# Patient Record
Sex: Female | Born: 1935 | Race: Black or African American | Hispanic: No | Marital: Married | Smoking: Never smoker
Health system: Southern US, Community
[De-identification: ages and names within clinical notes are randomized; demographics above are authoritative.]

## PROBLEM LIST (undated history)

## (undated) DIAGNOSIS — I10 Essential (primary) hypertension: Secondary | ICD-10-CM

## (undated) DIAGNOSIS — E119 Type 2 diabetes mellitus without complications: Secondary | ICD-10-CM

## (undated) DIAGNOSIS — I219 Acute myocardial infarction, unspecified: Secondary | ICD-10-CM

## (undated) DIAGNOSIS — E785 Hyperlipidemia, unspecified: Secondary | ICD-10-CM

## (undated) DIAGNOSIS — E039 Hypothyroidism, unspecified: Secondary | ICD-10-CM

## (undated) DIAGNOSIS — K5790 Diverticulosis of intestine, part unspecified, without perforation or abscess without bleeding: Secondary | ICD-10-CM

## (undated) DIAGNOSIS — I2510 Atherosclerotic heart disease of native coronary artery without angina pectoris: Secondary | ICD-10-CM

## (undated) DIAGNOSIS — I447 Left bundle-branch block, unspecified: Secondary | ICD-10-CM

## (undated) HISTORY — DX: Left bundle-branch block, unspecified: I44.7

## (undated) HISTORY — DX: Essential (primary) hypertension: I10

## (undated) HISTORY — DX: Atherosclerotic heart disease of native coronary artery without angina pectoris: I25.10

## (undated) HISTORY — DX: Hypothyroidism, unspecified: E03.9

## (undated) HISTORY — PX: TUBAL LIGATION: SHX77

## (undated) HISTORY — PX: THYROIDECTOMY: SHX17

## (undated) HISTORY — PX: ABDOMINAL HYSTERECTOMY: SHX81

## (undated) HISTORY — DX: Type 2 diabetes mellitus without complications: E11.9

## (undated) HISTORY — DX: Hyperlipidemia, unspecified: E78.5

## (undated) HISTORY — DX: Acute myocardial infarction, unspecified: I21.9

## (undated) HISTORY — DX: Diverticulosis of intestine, part unspecified, without perforation or abscess without bleeding: K57.90

---

## 2009-04-20 ENCOUNTER — Encounter (INDEPENDENT_AMBULATORY_CARE_PROVIDER_SITE_OTHER): Payer: Self-pay | Admitting: Family Medicine

## 2009-04-20 ENCOUNTER — Ambulatory Visit (HOSPITAL_COMMUNITY): Admission: RE | Admit: 2009-04-20 | Discharge: 2009-04-20 | Payer: Self-pay | Admitting: Family Medicine

## 2009-04-20 ENCOUNTER — Ambulatory Visit: Payer: Self-pay

## 2009-04-20 ENCOUNTER — Ambulatory Visit: Payer: Self-pay | Admitting: Cardiology

## 2009-06-15 ENCOUNTER — Encounter: Admission: RE | Admit: 2009-06-15 | Discharge: 2009-06-15 | Payer: Self-pay | Admitting: Emergency Medicine

## 2010-06-30 ENCOUNTER — Inpatient Hospital Stay (HOSPITAL_COMMUNITY)
Admission: EM | Admit: 2010-06-30 | Discharge: 2010-07-04 | DRG: 249 | Disposition: A | Discharge status: Home / Self Care | Payer: Medicare Other | Attending: Cardiology | Admitting: Cardiology

## 2010-06-30 ENCOUNTER — Other Ambulatory Visit: Payer: Self-pay | Admitting: Family Medicine

## 2010-06-30 ENCOUNTER — Emergency Department (HOSPITAL_COMMUNITY): Payer: Medicare Other

## 2010-06-30 ENCOUNTER — Ambulatory Visit
Admission: RE | Admit: 2010-06-30 | Discharge: 2010-06-30 | Disposition: A | Discharge status: Home / Self Care | Payer: Medicare Other | Source: Ambulatory Visit | Attending: Family Medicine | Admitting: Family Medicine

## 2010-06-30 DIAGNOSIS — R1032 Left lower quadrant pain: Secondary | ICD-10-CM

## 2010-06-30 DIAGNOSIS — E039 Hypothyroidism, unspecified: Secondary | ICD-10-CM | POA: Diagnosis present

## 2010-06-30 DIAGNOSIS — I2510 Atherosclerotic heart disease of native coronary artery without angina pectoris: Secondary | ICD-10-CM | POA: Diagnosis present

## 2010-06-30 DIAGNOSIS — I447 Left bundle-branch block, unspecified: Secondary | ICD-10-CM | POA: Diagnosis present

## 2010-06-30 DIAGNOSIS — E876 Hypokalemia: Secondary | ICD-10-CM | POA: Diagnosis not present

## 2010-06-30 DIAGNOSIS — K5730 Diverticulosis of large intestine without perforation or abscess without bleeding: Secondary | ICD-10-CM | POA: Diagnosis present

## 2010-06-30 DIAGNOSIS — R079 Chest pain, unspecified: Secondary | ICD-10-CM

## 2010-06-30 DIAGNOSIS — R7989 Other specified abnormal findings of blood chemistry: Secondary | ICD-10-CM

## 2010-06-30 DIAGNOSIS — I214 Non-ST elevation (NSTEMI) myocardial infarction: Principal | ICD-10-CM | POA: Diagnosis present

## 2010-06-30 DIAGNOSIS — I10 Essential (primary) hypertension: Secondary | ICD-10-CM | POA: Diagnosis present

## 2010-06-30 LAB — COMPREHENSIVE METABOLIC PANEL
AST: 35 U/L (ref 0–37)
Albumin: 4.1 g/dL (ref 3.5–5.2)
BUN: 11 mg/dL (ref 6–23)
CO2: 28 mEq/L (ref 19–32)
Calcium: 9.6 mg/dL (ref 8.4–10.5)
Chloride: 97 mEq/L (ref 96–112)
Glucose, Bld: 204 mg/dL — ABNORMAL HIGH (ref 70–99)
Potassium: 4 mEq/L (ref 3.5–5.1)
Total Protein: 7 g/dL (ref 6.0–8.3)

## 2010-06-30 LAB — CBC
MCH: 30.9 pg (ref 26.0–34.0)
MCHC: 34.1 g/dL (ref 30.0–36.0)
MCV: 90.5 fL (ref 78.0–100.0)
RDW: 12.9 % (ref 11.5–15.5)

## 2010-06-30 LAB — DIFFERENTIAL: Monocytes Absolute: 0.5 10*3/uL (ref 0.1–1.0)

## 2010-07-01 DIAGNOSIS — I200 Unstable angina: Secondary | ICD-10-CM

## 2010-07-01 LAB — CARDIAC PANEL(CRET KIN+CKTOT+MB+TROPI)
CK, MB: 16.3 ng/mL (ref 0.3–4.0)
CK, MB: 16.8 ng/mL (ref 0.3–4.0)
Total CK: 188 U/L — ABNORMAL HIGH (ref 7–177)
Troponin I: 0.07 ng/mL — ABNORMAL HIGH (ref 0.00–0.06)

## 2010-07-01 LAB — LIPID PANEL
Cholesterol: 153 mg/dL (ref 0–200)
LDL Cholesterol: 96 mg/dL (ref 0–99)
Total CHOL/HDL Ratio: 3.3 RATIO
Triglycerides: 54 mg/dL (ref <150)

## 2010-07-01 LAB — URINALYSIS, ROUTINE W REFLEX MICROSCOPIC
Bilirubin Urine: NEGATIVE
Glucose, UA: NEGATIVE mg/dL
Ketones, ur: NEGATIVE mg/dL
Protein, ur: NEGATIVE mg/dL
Urobilinogen, UA: 0.2 mg/dL (ref 0.0–1.0)
pH: 6 (ref 5.0–8.0)

## 2010-07-01 LAB — CK TOTAL AND CKMB (NOT AT ARMC)
Relative Index: 8.1 — ABNORMAL HIGH (ref 0.0–2.5)
Total CK: 172 U/L (ref 7–177)

## 2010-07-02 DIAGNOSIS — I059 Rheumatic mitral valve disease, unspecified: Secondary | ICD-10-CM

## 2010-07-02 LAB — CBC
Hemoglobin: 12.5 g/dL (ref 12.0–15.0)
MCH: 31.3 pg (ref 26.0–34.0)
MCV: 93 fL (ref 78.0–100.0)
RBC: 4 MIL/uL (ref 3.87–5.11)
WBC: 4.8 10*3/uL (ref 4.0–10.5)

## 2010-07-02 LAB — COMPREHENSIVE METABOLIC PANEL
Albumin: 3.3 g/dL — ABNORMAL LOW (ref 3.5–5.2)
Alkaline Phosphatase: 47 U/L (ref 39–117)
BUN: 15 mg/dL (ref 6–23)
Calcium: 9 mg/dL (ref 8.4–10.5)
Chloride: 104 mEq/L (ref 96–112)
GFR calc non Af Amer: 48 mL/min — ABNORMAL LOW (ref >60)
Glucose, Bld: 106 mg/dL — ABNORMAL HIGH (ref 70–99)
Sodium: 140 mEq/L (ref 135–145)
Total Bilirubin: 0.3 mg/dL (ref 0.3–1.2)
Total Protein: 6 g/dL (ref 6.0–8.3)

## 2010-07-03 DIAGNOSIS — I2510 Atherosclerotic heart disease of native coronary artery without angina pectoris: Secondary | ICD-10-CM

## 2010-07-03 LAB — CBC
HCT: 34.2 % — ABNORMAL LOW (ref 36.0–46.0)
Platelets: 207 10*3/uL (ref 150–400)
RDW: 13.3 % (ref 11.5–15.5)
WBC: 6.3 10*3/uL (ref 4.0–10.5)

## 2010-07-03 LAB — BASIC METABOLIC PANEL
GFR calc non Af Amer: 50 mL/min — ABNORMAL LOW (ref >60)
Glucose, Bld: 113 mg/dL — ABNORMAL HIGH (ref 70–99)
Potassium: 3.6 mEq/L (ref 3.5–5.1)
Sodium: 140 mEq/L (ref 135–145)

## 2010-07-03 LAB — PROTIME-INR: INR: 1.01 (ref 0.00–1.49)

## 2010-07-04 DIAGNOSIS — I214 Non-ST elevation (NSTEMI) myocardial infarction: Secondary | ICD-10-CM

## 2010-07-04 LAB — CBC
HCT: 35 % — ABNORMAL LOW (ref 36.0–46.0)
Hemoglobin: 11.8 g/dL — ABNORMAL LOW (ref 12.0–15.0)
MCV: 91.6 fL (ref 78.0–100.0)
RBC: 3.82 MIL/uL — ABNORMAL LOW (ref 3.87–5.11)
WBC: 6.7 10*3/uL (ref 4.0–10.5)

## 2010-07-04 LAB — BASIC METABOLIC PANEL
Chloride: 105 mEq/L (ref 96–112)
GFR calc Af Amer: >60 mL/min (ref >60)
Potassium: 3.5 mEq/L (ref 3.5–5.1)
Sodium: 138 mEq/L (ref 135–145)

## 2010-07-04 NOTE — Procedures (Signed)
  NAMESUNNY, KORFHAGE NO.:  [id number]  MEDICAL RECORD NO.:  [id number]           PATIENT TYPE:  I  LOCATION:  6523                         FACILITY:  [place]  PHYSICIAN:  Camdyn Laden M. Martinique, M.D.  DATE OF BIRTH:  May 13, 1931  DATE OF PROCEDURE:  07/03/2010 DATE OF DISCHARGE:                           CARDIAC CATHETERIZATION   INDICATIONS FOR PROCEDURE:  A 77 year old African American female with history of hypertension, who presents with a non-ST-elevation myocardial infarction.  She has a left bundle branch block.  PROCEDURE:  Left heart catheterization, coronary and left ventricular angiography.  ACCESS:  Via the right femoral artery using the standard Seldinger technique.  EQUIPMENT:  5-French 4 cm right and left Judkins catheter, 5-French pigtail catheter, 5-French arterial sheath, 5-French JL-3.5 guide, a Prowater wire, a 3.0 x 12 mm VeriFLEX stent, a 3.25 x 9-mm [place] Sprinter RX balloon.  MEDICATIONS:  Local anesthesia, 1% Xylocaine, Versed 1 mg IV, fentanyl 25 mcg IV, Plavix 600 mg p.o., nitroglycerin 200 mcg intracoronary x1, Angiomax 0.75 mg/kg, IV bolus followed by continuous infusion and 1.75 mg/kg per hour.  Subsequent ACT was 370.  CONTRAST:  110 mL of Omnipaque.  HEMODYNAMIC DATA:  Aortic pressure is 117/52 with a mean of 76 mmHg. Left ventricular pressure is 120 with an EDP of 11 mmHg.  ANGIOGRAPHIC DATA:  The left coronary artery arises and distributes normally.  The left main coronary artery is normal.  The left anterior descending artery has a focal 80% stenosis in the midvessel.  There is a moderate sized intermediate branch, which is normal.  The left circumflex coronary artery is normal.  The right coronary artery is a dominant vessel.  Has diffuse irregularities in the proximal to mid vessel, less than or equal to 20%.  Left ventricular angiography performed in the RAO view demonstrates normal left ventricular size with vigorous  contractility.  Ejection fraction is estimated at 65%-70%.  We proceeded with intervention of the mid-LAD stenosis.  This lesion was crossed easily with a guidewire.  We primarily stented this using the 3.0 x 12-mm VeriFLEX stent, deploying it at 11 atmospheres.  The stent was postdilated with a 3.25 x 9-mm [place] Sprinter balloon up to 14 atmospheres x2.  This yielded an excellent angiographic result with 0% residual stenosis and TIMI grade 3 flow.  FINAL INTERPRETATION: 1. Single-vessel obstructive atherosclerotic coronary artery disease. 2. Normal left ventricular function. 3. Successful stenting of the mid-LAD with a bare-metal stent.  PLAN:  We will continue on aspirin and Plavix.          ______________________________ Elle Vezina M. Martinique, M.D.     PMJ/MEDQ  D:  07/03/2010  T:  07/04/2010  Job:  [id number]  cc:   Shaune Pascal. Bensimhon, MD Derinda Late, M.D.  Electronically Signed by Annahi Short Martinique M.D. on 07/04/2010 11:18:58 AM

## 2010-07-13 NOTE — H&P (Signed)
NAMESHERRYLL, DESA NO.:  [id number]  MEDICAL RECORD NO.:  [id number]           PATIENT TYPE:  E  LOCATION:  [facility]                         FACILITY:  [place]  PHYSICIAN:  Arlyss Repress, MD        DATE OF BIRTH:  1932-03-20  DATE OF ADMISSION:  06/30/2010 DATE OF DISCHARGE:                             HISTORY & PHYSICAL   CHIEF COMPLAINT:  I have been having left lower quadrant pain.  HISTORY OF PRESENT ILLNESS:  A 77 year old female with a history of hypertension and hypothyroidism apparently complains of left lower quadrant pain starting on Tuesday.  It was sharp and intense and on Wednesday, it became more intense and was associated with nausea.  The patient was apparently treated with Cipro by her primary care physician and today apparently had a CT scan of the abdomen and pelvis which was negative other than mild colonic diverticulosis without evidence of diverticulitis.  There was mild thickening of the bladder wall which may relate to non-distention and cannot exclude cystitis.  There is no urinalysis available at the present time.  The patient, however, today actually noted to her primary care physician that she had chest pain 2 weeks ago and that is probably why he sent her to the emergency room for evaluation.  Her EKG showed left bundle-branch block with a rate of 74. Normal axis and poor R-wave progression.  We do not have an old EKG for comparison and her troponin was elevated at 0.07, her CK-MB 12.7, and relative index 7.9.  We are still awaiting a second set of cardiac markers at this time.  They should be drawn very soon.  For the time being, we will anticoagulate her with Lovenox 1 mg/kg subcu x1 and treat with aspirin and metoprolol as suggested by Cardiology.  The patient has been evaluated by Dr. Haroldine Laws and we obviously appreciate his expertise.  Chest x-ray today was negative.  The patient will be admitted for workup of chest pain as well  as left lower quadrant pain which was probably likely secondary to diverticulitis which is now resolving.  PAST MEDICAL HISTORY: 1. Hypertension. 2. Hypothyroidism diagnosed 2 years ago.  PAST SURGICAL HISTORY: 1. Thyroidectomy. 2. Tubal ligation.  SOCIAL HISTORY:  The patient is married and has 3 children.  She does not smoke or drink.  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  FAMILY HISTORY:  Mother died of a stroke and was a nonsmoker and had hypertension and died on actually further questioning of gangrene of left leg.  She had a stroke years prior.  Father died of heart attack at age 38, was a nonsmoker.  She has 2 sisters, one who has had breast cancer and one who has had lung cancer.  There are 6 sisters all together and one brother who she has no idea of their past medical history of.  REVIEW OF SYSTEMS:  Negative for all 10-organ systems except for pertinent positives as stated above.  The patient experienced chest pain about 2 weeks ago at rest.  It was associated  with shortness of breath and nausea and lasted for about 5 minutes.  When she lifted her chest wall, she felt better.  There was no palpitations or diaphoresis.  She just felt sick on her stomach.  The patient had a second episode that same day and everything resolved by itself without intervention.  ALLERGIES:  No known drug allergies.  MEDICATIONS: 1. Chlorthalidone 25 mg p.o. daily. 2. Levothyroxine 50 mcg p.o. daily. 3. Lisinopril 40 mg p.o. daily. 4. Phenergan. 5. Cipro.  PHYSICAL EXAMINATION:  VITAL SIGNS:  Temperature 98.6, pulse 80, blood pressure 137/48, and pulse oximetry of 96% on room air. HEENT:  Anicteric, EOMI.  No nystagmus.  Pupils 1.5 mm, symmetric. Direct and consensual near reflex intact.  Mucous membranes moist. NECK:  No JVD.  No bruit. HEART:  Regular rate and rhythm.  S1 and S2.  No murmurs, gallops, or rubs. CHEST WALL:  Slightly tender, but she is not sure  if it is reproducible chest pain. LUNGS:  Clear to auscultation bilaterally. ABDOMEN:  Soft, nontender, and nondistended.  Positive bowel sounds. EXTREMITIES:  No cyanosis, clubbing, or edema. SKIN:  No rashes. LYMPH NODES:  No adenopathy. NEUROLOGIC:  Nonfocal.  Cranial nerves II through XII intact.  Reflexes 2+, symmetric, diffuse with downgoing toes bilaterally.  Strength 5/5 in all 4 extremities.  Pinprick intact.  Chest x-ray negative for any acute process.  CT scan of the abdomen and pelvis, no acute abdominal or pelvic process, mild colonic diverticulosis without evidence of diverticulitis, mild thickening of the bladder wall, may be related to non-distention, cannot exclude cystitis.  EKG as stated above in HPI.  ASSESSMENT AND PLAN: 1. Left lower quadrant pain, likely secondary to diverticulitis versus     cystitis which is now resolved with treatment of Cipro.  We will     continue Cipro for now. 2. Chest pain with positive cardiac markers.  Lovenox 1 mg/kg subcu     x1, metoprolol as per Cardiology recommendations, enteric-coated     aspirin, and Lipitor 80 mg p.o. at bedtime first dose now.  We will     check another set of cardiac markers and cycle them.  We will also     check a lipid panel, hemoglobin A1c. 3. Hypothyroidism.  Continue Synthroid. 4. Hypertension, controlled.  Hold off on chlorthalidone at this point     in time and continue lisinopril and we will use metoprolol. 5. Deep vein thrombosis prophylaxis.  Lovenox as stated above.     Arlyss Repress, MD     JYK/MEDQ  D:  07/01/2010  T:  07/01/2010  Job:  [id number]  cc:   Derinda Late, M.D.  Electronically Signed by Jani Gravel MD on 07/13/2010 02:05:44 AM

## 2010-07-18 ENCOUNTER — Ambulatory Visit (INDEPENDENT_AMBULATORY_CARE_PROVIDER_SITE_OTHER): Payer: Medicare Other | Admitting: Physician Assistant

## 2010-07-18 ENCOUNTER — Encounter: Payer: Self-pay | Admitting: Physician Assistant

## 2010-07-18 VITALS — BP 100/70 | HR 55 | Ht 63.0 in | Wt 170.0 lb

## 2010-07-18 DIAGNOSIS — I2510 Atherosclerotic heart disease of native coronary artery without angina pectoris: Secondary | ICD-10-CM

## 2010-07-18 DIAGNOSIS — E119 Type 2 diabetes mellitus without complications: Secondary | ICD-10-CM | POA: Insufficient documentation

## 2010-07-18 DIAGNOSIS — I10 Essential (primary) hypertension: Secondary | ICD-10-CM | POA: Insufficient documentation

## 2010-07-18 DIAGNOSIS — R001 Bradycardia, unspecified: Secondary | ICD-10-CM

## 2010-07-18 DIAGNOSIS — E785 Hyperlipidemia, unspecified: Secondary | ICD-10-CM | POA: Insufficient documentation

## 2010-07-18 DIAGNOSIS — I498 Other specified cardiac arrhythmias: Secondary | ICD-10-CM

## 2010-07-18 DIAGNOSIS — E039 Hypothyroidism, unspecified: Secondary | ICD-10-CM | POA: Insufficient documentation

## 2010-07-18 DIAGNOSIS — I447 Left bundle-branch block, unspecified: Secondary | ICD-10-CM | POA: Insufficient documentation

## 2010-07-18 MED ORDER — CLOPIDOGREL BISULFATE 75 MG PO TABS: 75.0000 mg | ORAL_TABLET | Freq: Every day | ORAL | Status: DC

## 2010-07-18 MED ORDER — ROSUVASTATIN CALCIUM 40 MG PO TABS: 40.0000 mg | ORAL_TABLET | Freq: Every day | ORAL | Status: DC

## 2010-07-18 NOTE — Assessment & Plan Note (Signed)
Controlled.  Continue current therapy.  

## 2010-07-18 NOTE — Patient Instructions (Signed)
Your physician recommends that you schedule a follow-up appointment in: 2 months with Dr. Haroldine Laws as per Richardson Dopp, PA-C.  Your physician recommends that you return for lab work in: 6-8 weeks for fasting liver/lipid panel 272.4.  You have been referred to [facility] 414.00

## 2010-07-18 NOTE — Assessment & Plan Note (Signed)
On high dose Crestor.  Repeat lipids and LFTs in 6-8 weeks.

## 2010-07-18 NOTE — Assessment & Plan Note (Signed)
Doing well post MI with intervention to the LAD.  She is on aspirin and Plavix.  She is interested in cardiac rehabilitation.  We will make that referral.  She will return to see Dr. Haroldine Laws in 2 months.

## 2010-07-18 NOTE — Progress Notes (Signed)
History of Present Illness: Primary Cardiologist:  Dr. Glori Bickers  [name] is a 77 y.o. female with a h/o DM2 and HTN who was admitted to [facility] 3/30 to 4/3 with a NSTEMI.  Cardiac cath demonstrated mid LAD 80% stenosis which was treated with a BMS.  Initial complaints were abdominal pain.  CT demonstrated diverticulosis but no acute findings.  LDL was 96.  ALT was 17.  She was placed on high dose statin.  She returns for follow up.  She is doing well.  She denies any further flank pain.  She denies chest pain.  She denies shortness of breath.  She denies orthopnea, PND or edema.  Her catheter site is stable without pain.  She denies syncope.  Past Medical History  Diagnosis Date  . CAD (coronary artery disease)     a. s/p NSTEMI 06/30/10 tx with BMS to LAD;  b. cath 4/12: mLAD 80% (PCI), RCA 20%; EF 65-70%;  c. echo 1/11: EF 65%, mild MR  . DM2 (diabetes mellitus, type 2)   . HTN (hypertension)   . Hypothyroidism   . Dyslipidemia   . LBBB (left bundle branch block)     Current Outpatient Prescriptions  Medication Sig Dispense Refill  . aspirin 81 MG tablet Take 81 mg by mouth daily.        . carvedilol (COREG) 3.125 MG tablet Take 3.125 mg by mouth 2 (two) times daily with a meal.        . clopidogrel (PLAVIX) 75 MG tablet Take 75 mg by mouth daily.        Marland Kitchen levothyroxine (SYNTHROID, LEVOTHROID) 50 MCG tablet Take 50 mcg by mouth daily.        Marland Kitchen lisinopril (PRINIVIL,ZESTRIL) 40 MG tablet Take 40 mg by mouth daily.        . rosuvastatin (CRESTOR) 40 MG tablet Take 40 mg by mouth daily.          No Known Allergies  Vital Signs: BP 100/70  Pulse 55  Ht 5\' 3"  (1.6 m)  Wt 170 lb (77.111 kg)  BMI 30.11 kg/m2  PHYSICAL EXAM: Well nourished, well developed, in no acute distress HEENT: normal Neck: no JVD Cardiac:  normal S1, S2; RRR; no murmur Lungs:  clear to auscultation bilaterally, no wheezing, rhonchi or rales Abd: soft, nontender, no hepatomegaly Ext: no edema; RFA  site without hematoma or bruit Skin: warm and dry Neuro:  CNs 2-12 intact, no focal abnormalities noted  EKG:  Sinus bradycardia, HR 55, LBBB  ASSESSMENT AND PLAN:

## 2010-07-20 ENCOUNTER — Telehealth: Payer: Self-pay | Admitting: Cardiology

## 2010-07-20 ENCOUNTER — Telehealth: Payer: Self-pay | Admitting: Internal Medicine

## 2010-07-20 NOTE — Telephone Encounter (Signed)
The patient does not need a GXT before starting cardiac rehab.

## 2010-07-20 NOTE — Telephone Encounter (Signed)
Will need to verify with Dr Percival Spanish if pt needs a GXT before she can start cardiac rehab.  Terri states that's how the referral is marked however pt has not been scheduled.

## 2010-07-20 NOTE — Telephone Encounter (Signed)
Received form from pharmacy that crestor needed prior auth, called them at [phone] pts ID [id number], med was denied they state pt must try Lipitor or simvastatin first, per Dr Haroldine Laws can change to Lipitor 80mg , called pt she states that was her husband insurance, but that her insurance covered the med, advised to cont. It if they cover it when she gets it refilled to call me back

## 2010-07-21 ENCOUNTER — Telehealth: Payer: Self-pay | Admitting: *Deleted

## 2010-07-21 ENCOUNTER — Telehealth: Payer: Self-pay | Admitting: Physician Assistant

## 2010-07-21 NOTE — Telephone Encounter (Signed)
See phone note

## 2010-07-21 NOTE — Telephone Encounter (Signed)
Spoke with Dr. Sandi Mariscal.  Patient cannot afford Crestor and having some indigestion.  He switched her to Lipitor 40.  He also placed her on Nexium.  I suggested he use Protonix or Prevacid due to concerns over interference with Plavix. She has labs ordered in early May and he plans to repeat fasting Lipids.

## 2010-07-21 NOTE — Telephone Encounter (Signed)
[name] aware but does not need a GXT however she is requesting a order be faxed to her stating that.  Will fax information as requested to [phone]

## 2010-08-01 NOTE — Consult Note (Signed)
NAMEJALIYHA, [name]                  ACCOUNT NO.:  [id number]  MEDICAL RECORD NO.:  [id number]           PATIENT TYPE:  E  LOCATION:  [facility]                         FACILITY:  [place]  PHYSICIAN:  Shaune Pascal. Bensimhon, MDDATE OF BIRTH:  1931-11-03  DATE OF CONSULTATION:  06/30/2010 DATE OF DISCHARGE:                                CONSULTATION   PRIMARY CARE PHYSICIAN:  Derinda Late, MD  CARDIOLOGIST:  She is new to [facility].  REQUESTING PHYSICIAN:  [name]. Carrie Cordia, MD, [facility].  REASON FOR CONSULTATION:  Chest pain with mildly elevated cardiac markers.  HISTORY OF PRESENT ILLNESS:  Ms. [name] is a delightful 77 year old woman with a history of hypertension, hypothyroidism.  She denies any known history of coronary artery disease.  She did say she had a remote stress test many years ago when her son died but says this was negative.  She has never had a cardiac catheterization or other cardiac workup.  Last Saturday night, she developed left flank and groin pain associated with nausea and vomiting.  Apparently, she saw Dr. Sandi Meyer on Tuesday. She said he did some blood work.  He also did a urinalysis.  She was started on ciprofloxacin and Phenergan.  On Wednesday night, she was at home, had 2 episodes of transient substernal chest pain, each lasted about 5 minutes.  This was accompanied by shortness of breath and resolved spontaneously.  She has not had recurrent chest pain.  She says at baseline she is fairly active.  She is able to get around and do her shopping and other household chores without any dyspnea or angina.  Tonight, she was at home and had recurrent severe flank pain.  She talked to Dr. Sandi Meyer and came to the ER.  She had CT of the abdomen and pelvis which is essentially normal.  She also had mildly elevated cardiac markers with troponin of 0.07, CK of 161, and MB of 12.7 with a relative index of 7.9%.  So, we were called to evaluate.   EKG showed sinus rhythm, left bundle-branch block.  I did not have any old EKGs to compare.  REVIEW OF SYSTEMS:  She denies any fevers or chills.  No current chest pain.  No heart failure symptoms.  No dysuria.  No bright red blood per rectum or melena.  No diarrhea.  Remainder of the review of systems are all systems negative except for HPI and problem list.  PROBLEM LIST: 1. Hypertension, well controlled. 2. Hypothyroidism.  CURRENT MEDICATIONS:  Ciprofloxacin, Phenergan p.r.n., lisinopril 40 a day, chlorthalidone 25 a day, and Synthroid 50 mcg per day.  ALLERGIES:  She has no known drug allergies.  SOCIAL HISTORY:  She is married.  She is here with her husband.  She is retired from a Insurance account manager.  She denies any history of tobacco or alcohol use.  FAMILY HISTORY:  Mother died from a stroke.  She also has a history of heart disease.  Father died from myocardial infarction.  PHYSICAL EXAMINATION:  GENERAL:  She is an elderly woman, in no acute distress.  She is lying flat in bed with no respiratory difficulty. VITAL SIGNS:  Blood pressure is currently 126/65, heart rate is 57, satting 99% on room air. HEENT:  Normal. NECK:  Supple.  No JVD.  Carotid 2+ bilaterally without bruits.  There is no lymphadenopathy or thyromegaly. CARDIAC:  PMI is nondisplaced.  She has distant heart sounds.  She is regular.  No obvious murmurs, rubs, or gallops. LUNGS:  Clear.  Chest wall, she is tender over her sternum and xiphoid process. ABDOMEN:  Soft, nontender, nondistended.  No hepatosplenomegaly, no bruits.  No masses. EXTREMITIES:  Warm with no cyanosis, clubbing, or edema.  There are good pulses.  No rashes. NEUROLOGIC:  Alert and oriented x3.  Cranial nerves II-XII are intact. Moves all 4 extremities without difficulty.  Affect is pleasant.  EKG shows sinus rhythm at a rate of 60 with a left bundle-branch block. No old to compare.  Sodium 135, potassium 4.0,  BUN 11, creatinine 1.23. White count 6.9, hemoglobin 13.7, platelet 245,000.  LFTs and bilirubin are normal.  Chest x-ray is normal.  ASSESSMENT: 1. Left flank and groin pain. 2. Chest pain and shortness of breath which occurred Wednesday night,     now resolved. 3. Left bundle-branch block of unknown duration. 4. Mildly elevated CK-MB. 5. History of hypertension, well controlled.  PLAN/DISCUSSION:  Ms. [name] chest pain and mildly elevated cardiac enzymes are concerning for possible subacute coronary syndrome, but currently she is pain free for at least 48 hours.  We will cycle her cardiac markers, start aspirin and beta-blockers as her heart rate tolerates it.  We will also check an echocardiogram.  I suspect she will likely need a heart catheterization though she is a bit reluctant about this.  She will be admitted to the Internal Medicine Service to further evaluate her flank pain.  We appreciate their help.  We will continue to follow.     Shaune Pascal. Bensimhon, MD     DRB/MEDQ  D:  06/30/2010  T:  07/01/2010  Job:  [id number]  Electronically Signed by Glori Bickers MD on 08/01/2010 07:11:41 PM

## 2010-08-01 NOTE — Discharge Summary (Signed)
[name], [name]                  ACCOUNT NO.:  [id number]  MEDICAL RECORD NO.:  [id number]           PATIENT TYPE:  I  LOCATION:  6523                         FACILITY:  [facility]  PHYSICIAN:  Shaune Pascal. Elajah Kunsman, MDDATE OF BIRTH:  04-03-1931  DATE OF ADMISSION:  06/30/2010 DATE OF DISCHARGE:  07/04/2010                              DISCHARGE SUMMARY   PRIMARY CARDIOLOGIST:  Shaune Pascal. Velmer Woelfel, MD  PRIMARY CARE PROVIDER:  Derinda Late, MD  DISCHARGE DIAGNOSIS: 1. Non-ST-elevation myocardial infarction. 2. Single vessel obstructive arteriosclerotic coronary artery disease     status post bare-metal stent to the mid left anterior descending     artery, this admission. 3. Preserved left ventricular function. 4. Groin, resolved.     a.     Negative abdominal CT this admission. 5. Hypokalemia, repleted. 6. Hypertension.  SECONDARY DIAGNOSIS:  Hypothyroidism.  ALLERGIES:  No known drug allergies.  PROCEDURES/DIAGNOSTICS PERFORMED DURING HOSPITALIZATION: 1. Left heart catheterization, coronary and left ventricular     angiography.     a.     Single vessel obstructive arteriosclerosis coronary artery      disease, left anterior descending artery had a focal 80% stenosis      in the mid vessel.     b.     Left main coronary artery is normal.  Left circumflex artery      normal.  Right coronary artery has diffuse irregularities in the      proximal to mid vessel, less than 20%.     c.     Status post placement of VeriFLEX bare-metal stent to the      mid LAD, 3.0 mm x 12 mm. 2  CT of the abdomen and pelvis without contrast on June 30, 2010:  No acute abdominal or pelvic process.  Mild colonic diverticulosis without evidence of diverticulitis.  Mild thickening of the bladder wall may relate to non-distention, cannot exclude cystitis. 1. Chest x-ray on June 30, 2010:  No acute cardiopulmonary process.  REASON FOR HOSPITALIZATION:  This is a 77 year old female with the  above- stated problem list who developed left flank and groin pain associated with nausea and vomiting approximately 7 days prior to admission.  She was evaluated by her primary care provider and given ciprofloxacin and Phenergan.  Next to eating the patient had a transient substernal chest pain that lasted approximately 5 minutes was accompanied with shortness of breath and resolved spontaneously.  She had no recurrent chest pain since this time.  The patient denies any exertional chest pain.  On day of admission, the patient had recurrent severe flank pain, her primary care provider advised her to go to the emergency department.  A CT of the abdomen and pelvis was eventually normal.  She had mildly elevated cardiac markers with a troponin of 0.07, CK of 151 and MB of 12.7. Cardiology was asked to evaluate the patient with an EKG showing sinus rhythm and a left bundle-branch block, and no old tracings for comparisons.  HOSPITAL COURSE:  The patient was admitted and placed on aspirin as well as a beta-blocker.  The patient  subsequently ruled in for a non-ST elevation myocardial infarction.  Although she had no further chest pain with the patient's NSTEMI cardiac catheterization was indicated.  Risks, benefits and indications were discussed with the patient and she agreed to proceed.  On July 03, 2010, Dr. Martinique brought the patient to the cath lab, informed consent was obtained.  As above, the patient was noted to have severe single vessel obstructive coronary artery disease. Dr. Martinique performed successful stenting of the mid LAD with bare-metal stenting.  It was noted that the patient should be continued on aspirin, Plavix for at least 1 month.  She will be continued on statin and low- dose beta-blocker.  The patient's left ventricular function was preserved, ejection fraction was estimated at 65-70%.  The patient tolerated the procedure well.  Her right groin site was without  evidence of hematoma.  The patient was initially admitted by the Hospitalist Service for workup of left-sided flank pain.  This resolved within 24 hours and with no significant abnormalities on CT as well as negative urinalysis.  It was felt that no further workup was needed at this time.  If the patient's flank pain persists, she is to see her primary care provider for further evaluation.  On day of discharge, Dr. Haroldine Laws evaluated the patient and noted her stable for home with outpatient follow-up.  The patient was interested in outpatient cardiac rehab, a referral has been sent.  The patient did ambulate with cardiac rehab and was able to do this without chest pain.  Of note, on day of discharge, the patient's potassium was 3.5, this was repleted prior to discharge.  The patient at this time will not need daily potassium supplementation.  At this time, the patient's chlorthalidone will be discontinued and she will be continued on beta- blocker, lisinopril, aspirin, Plavix and statin.  DISCHARGE LABORATORY DATA:  Sodium 138, potassium 3.5, BUN 9, creatinine 1.  WBC 6.7, hemoglobin 11.8, hematocrit 35, platelets 218.  TSH 2.262, hemoglobin A1c 6.8, total cholesterol 153, triglycerides 54, HDL 46, LDL 96.  DISCHARGE MEDICATIONS: 1. Aspirin 81 mg daily. 2. Coreg 3.125 mg twice daily. 3. Plavix 75 mg daily. 4. Crestor 40 mg daily. 5. Levothyroxine 50 mcg daily. 6. Lisinopril 40 mg daily. 7. Promethazine 25 mg 1 tablet every 4 hours as needed for nausea. 8. Vitamin D2 one tablet daily. 9. Please stop taking chlorthalidone as well as ciprofloxacin.  DISCHARGE/PLAN/INSTRUCTIONS: 1. The patient will follow up with Richardson Dopp, physician assistant     for Dr. Haroldine Laws on July 18, 2010, at 10:30 a.m. 2. The patient to increase activity slowly.  She may shower with     bathing.  No lifting for 1 week greater than 5 pounds.  No driving     for 2 days.  No sexual activity for 1  week.  She is to keep her     cath site clean and dry and to call the office for any problems. 3. The patient is to stop any activities because of chest pain and     shortness of breath.  She is to avoid straining. 4. The patient is to continue low-sodium heart-healthy diet. 5. The patient is to call our office in the interim for any problems     or concerns.  DURATION OF DISCHARGE:  Greater than 30 minutes with physician and physician extender time.     Cecille Amsterdam, PA-C   ______________________________ Shaune Pascal. Demitrus Francisco, MD    NB/MEDQ  D:  07/04/2010  T:  07/05/2010  Job:  [id number]  cc:   Derinda Late, M.D.  Electronically Signed by Pennie Rushing P.A. on 07/05/2010 10:43:27 AM Electronically Signed by Glori Bickers MD on 08/01/2010 07:11:36 PM

## 2010-09-07 ENCOUNTER — Ambulatory Visit: Payer: Medicare Other | Admitting: Internal Medicine

## 2010-09-11 ENCOUNTER — Other Ambulatory Visit: Payer: Medicare Other | Admitting: *Deleted

## 2010-09-13 ENCOUNTER — Ambulatory Visit (INDEPENDENT_AMBULATORY_CARE_PROVIDER_SITE_OTHER): Payer: Medicare Other | Admitting: Internal Medicine

## 2010-09-13 ENCOUNTER — Encounter: Payer: Self-pay | Admitting: Internal Medicine

## 2010-09-13 VITALS — BP 120/72 | HR 62 | Ht 61.0 in | Wt 165.0 lb

## 2010-09-13 DIAGNOSIS — R5383 Other fatigue: Secondary | ICD-10-CM

## 2010-09-13 DIAGNOSIS — E785 Hyperlipidemia, unspecified: Secondary | ICD-10-CM

## 2010-09-13 DIAGNOSIS — R5381 Other malaise: Secondary | ICD-10-CM

## 2010-09-13 DIAGNOSIS — I2510 Atherosclerotic heart disease of native coronary artery without angina pectoris: Secondary | ICD-10-CM

## 2010-09-13 MED ORDER — CHLORTHALIDONE 25 MG PO TABS: 25.0000 mg | ORAL_TABLET | ORAL | Status: DC | PRN

## 2010-09-13 NOTE — Assessment & Plan Note (Signed)
Followed by PCP. Goal LDL < 70. Continue statin.

## 2010-09-13 NOTE — Assessment & Plan Note (Signed)
May be med related. Stop chlorthalidone - use only as needed for edema. If still fatigued can consider stopping carvedilol; though given MI I would like to try to keep some b-blocker on board.

## 2010-09-13 NOTE — Assessment & Plan Note (Signed)
No evidence of ischemia. Continue current regimen. Refer to cardiac rehab.

## 2010-09-13 NOTE — Progress Notes (Signed)
  History of Present Illness:  PCP: Johnella Moloney Primary Cardiologist:  Dr. Glori Bickers  [name] is a 77 y.o. female with a h/o DM2 and HTN who was admitted to [facility] 3/30 to 4/3 with a NSTEMI.  Cardiac cath demonstrated mid LAD 80% stenosis which was treated with a BMS.  Initial complaints were abdominal pain.  CT demonstrated diverticulosis but no acute findings.  LDL was 96.  ALT was 17.  She was placed on high dose statin.  She returns for follow up.  She is doing well. She denies chest pain.  She denies shortness of breath.  She denies orthopnea, PND. Did have a little edema after a family pig-picking a couple of weeks ago. Saw Dr. Sandi Mariscal who started chlorthalidone. Edema. Resolved Since that time feeling very fatigued and run down. Weight down 5 pounds since April.  No snoring. Did not go to cardiac rehab.   Past Medical History  Diagnosis Date  . CAD (coronary artery disease)     a. s/p NSTEMI 06/30/10 tx with BMS to LAD;  b. cath 4/12: mLAD 80% (PCI), RCA 20%; EF 65-70%;  c. echo 1/11: EF 65%, mild MR  . DM2 (diabetes mellitus, type 2)     diet controlled  . HTN (hypertension)   . Hypothyroidism   . Dyslipidemia     Crestor started 4/12  . LBBB (left bundle branch block)     Current Outpatient Prescriptions  Medication Sig Dispense Refill  . aspirin 81 MG tablet Take 81 mg by mouth daily.        Marland Kitchen atorvastatin (LIPITOR) 40 MG tablet Take 40 mg by mouth daily.        . carvedilol (COREG) 3.125 MG tablet Take 3.125 mg by mouth 2 (two) times daily with a meal.        . chlorthalidone (HYGROTON) 25 MG tablet Take 25 mg by mouth daily.        . clopidogrel (PLAVIX) 75 MG tablet Take 1 tablet (75 mg total) by mouth daily.  30 tablet  11  . guaiFENesin (ROBITUSSIN) 100 MG/5ML liquid Take 200 mg by mouth 3 (three) times daily as needed.        Marland Kitchen levothyroxine (SYNTHROID, LEVOTHROID) 50 MCG tablet Take 50 mcg by mouth daily.        Marland Kitchen lisinopril (PRINIVIL,ZESTRIL) 40 MG tablet Take  40 mg by mouth daily.        . potassium chloride (KLOR-CON) 10 MEQ CR tablet Take 20 mEq by mouth daily.          No Known Allergies  Vital Signs: BP 120/72  Pulse 62  Ht 5\' 1"  (1.549 m)  Wt 165 lb (74.844 kg)  BMI 31.18 kg/m2  PHYSICAL EXAM: Well nourished, well developed, in no acute distress HEENT: normal Neck: no JVD Cardiac:  normal S1, S2; RRR; no murmur Lungs:  clear to auscultation bilaterally, no wheezing, rhonchi or rales Abd: soft, nontender, no hepatomegaly Ext: no edema; RFA site without hematoma or bruit Skin: warm and dry Neuro:  CNs 2-12 intact, no focal abnormalities noted    ASSESSMENT AND PLAN:

## 2010-09-13 NOTE — Patient Instructions (Signed)
Stop Chlorthalidone, only take as needed Lab today(bmet) Your physician wants you to follow-up in: 1 year.  You will receive a reminder letter in the mail two months in advance. If you don't receive a letter, please call our office to schedule the follow-up appointment.

## 2010-10-02 ENCOUNTER — Encounter (HOSPITAL_COMMUNITY): Payer: Medicare Other

## 2010-10-02 ENCOUNTER — Telehealth: Payer: Self-pay | Admitting: Physician Assistant

## 2010-10-02 NOTE — Telephone Encounter (Signed)
Received telephone call from Dr. Sandi Mariscal. He saw patient recently and called to inquire about recent changes in her chlorthalidone. She stopped the medicine, but continued full dose K+. She had some more edema and did not understand PRN dosing. Dr. Sandi Mariscal asked that he be contacted for any future medication changes.  He stated that the patient seems confused about changes made at times.   I faxed him the latest note from when she saw Dr. Haroldine Laws.

## 2010-10-04 ENCOUNTER — Encounter (HOSPITAL_COMMUNITY): Payer: Medicare Other

## 2010-10-06 ENCOUNTER — Encounter (HOSPITAL_COMMUNITY)
Admission: RE | Admit: 2010-10-06 | Discharge: 2010-10-06 | Disposition: A | Discharge status: Home / Self Care | Payer: Medicare Other | Source: Ambulatory Visit | Attending: Internal Medicine | Admitting: Internal Medicine

## 2010-10-06 DIAGNOSIS — I252 Old myocardial infarction: Secondary | ICD-10-CM | POA: Insufficient documentation

## 2010-10-06 DIAGNOSIS — Z9861 Coronary angioplasty status: Secondary | ICD-10-CM | POA: Insufficient documentation

## 2010-10-06 DIAGNOSIS — Z5189 Encounter for other specified aftercare: Secondary | ICD-10-CM | POA: Insufficient documentation

## 2010-10-06 DIAGNOSIS — E039 Hypothyroidism, unspecified: Secondary | ICD-10-CM | POA: Insufficient documentation

## 2010-10-06 DIAGNOSIS — I447 Left bundle-branch block, unspecified: Secondary | ICD-10-CM | POA: Insufficient documentation

## 2010-10-06 DIAGNOSIS — I2510 Atherosclerotic heart disease of native coronary artery without angina pectoris: Secondary | ICD-10-CM | POA: Insufficient documentation

## 2010-10-06 DIAGNOSIS — I10 Essential (primary) hypertension: Secondary | ICD-10-CM | POA: Insufficient documentation

## 2010-10-09 ENCOUNTER — Encounter (HOSPITAL_COMMUNITY): Payer: Medicare Other

## 2010-10-11 ENCOUNTER — Encounter (HOSPITAL_COMMUNITY): Payer: Medicare Other

## 2010-10-13 ENCOUNTER — Encounter (HOSPITAL_COMMUNITY): Payer: Medicare Other

## 2010-10-16 ENCOUNTER — Encounter (HOSPITAL_COMMUNITY): Payer: Medicare Other

## 2010-10-18 ENCOUNTER — Encounter (HOSPITAL_COMMUNITY): Payer: Medicare Other

## 2010-10-20 ENCOUNTER — Encounter (HOSPITAL_COMMUNITY): Payer: Medicare Other

## 2010-10-23 ENCOUNTER — Encounter (HOSPITAL_COMMUNITY): Payer: Medicare Other

## 2010-10-25 ENCOUNTER — Encounter (HOSPITAL_COMMUNITY): Payer: Medicare Other

## 2010-10-27 ENCOUNTER — Encounter (HOSPITAL_COMMUNITY): Payer: Medicare Other

## 2010-10-30 ENCOUNTER — Encounter (HOSPITAL_COMMUNITY): Payer: Medicare Other

## 2010-11-01 ENCOUNTER — Encounter (HOSPITAL_COMMUNITY): Payer: Medicare Other | Attending: Internal Medicine

## 2010-11-01 DIAGNOSIS — Z9861 Coronary angioplasty status: Secondary | ICD-10-CM | POA: Insufficient documentation

## 2010-11-01 DIAGNOSIS — I447 Left bundle-branch block, unspecified: Secondary | ICD-10-CM | POA: Insufficient documentation

## 2010-11-01 DIAGNOSIS — I10 Essential (primary) hypertension: Secondary | ICD-10-CM | POA: Insufficient documentation

## 2010-11-01 DIAGNOSIS — I252 Old myocardial infarction: Secondary | ICD-10-CM | POA: Insufficient documentation

## 2010-11-01 DIAGNOSIS — Z5189 Encounter for other specified aftercare: Secondary | ICD-10-CM | POA: Insufficient documentation

## 2010-11-01 DIAGNOSIS — E039 Hypothyroidism, unspecified: Secondary | ICD-10-CM | POA: Insufficient documentation

## 2010-11-01 DIAGNOSIS — I2510 Atherosclerotic heart disease of native coronary artery without angina pectoris: Secondary | ICD-10-CM | POA: Insufficient documentation

## 2010-11-03 ENCOUNTER — Encounter (HOSPITAL_COMMUNITY): Payer: Medicare Other

## 2010-11-06 ENCOUNTER — Encounter (HOSPITAL_COMMUNITY): Payer: Medicare Other

## 2010-11-08 ENCOUNTER — Encounter (HOSPITAL_COMMUNITY): Payer: Medicare Other

## 2010-11-10 ENCOUNTER — Encounter (HOSPITAL_COMMUNITY): Payer: Medicare Other

## 2010-11-13 ENCOUNTER — Encounter (HOSPITAL_COMMUNITY): Payer: Medicare Other

## 2010-11-15 ENCOUNTER — Encounter (HOSPITAL_COMMUNITY): Payer: Medicare Other

## 2010-11-17 ENCOUNTER — Encounter (HOSPITAL_COMMUNITY): Payer: Medicare Other

## 2010-11-20 ENCOUNTER — Encounter (HOSPITAL_COMMUNITY): Payer: Medicare Other

## 2010-11-22 ENCOUNTER — Encounter (HOSPITAL_COMMUNITY): Payer: Medicare Other

## 2010-11-24 ENCOUNTER — Encounter (HOSPITAL_COMMUNITY): Payer: Medicare Other

## 2010-11-27 ENCOUNTER — Encounter (HOSPITAL_COMMUNITY): Payer: Medicare Other

## 2010-11-29 ENCOUNTER — Encounter (HOSPITAL_COMMUNITY): Payer: Medicare Other

## 2010-12-01 ENCOUNTER — Encounter (HOSPITAL_COMMUNITY): Payer: Medicare Other

## 2010-12-04 ENCOUNTER — Encounter (HOSPITAL_COMMUNITY): Payer: Medicare Other

## 2010-12-06 ENCOUNTER — Encounter (HOSPITAL_COMMUNITY): Payer: Medicare Other | Attending: Internal Medicine

## 2010-12-06 DIAGNOSIS — I10 Essential (primary) hypertension: Secondary | ICD-10-CM | POA: Insufficient documentation

## 2010-12-06 DIAGNOSIS — Z9861 Coronary angioplasty status: Secondary | ICD-10-CM | POA: Insufficient documentation

## 2010-12-06 DIAGNOSIS — I252 Old myocardial infarction: Secondary | ICD-10-CM | POA: Insufficient documentation

## 2010-12-06 DIAGNOSIS — Z5189 Encounter for other specified aftercare: Secondary | ICD-10-CM | POA: Insufficient documentation

## 2010-12-06 DIAGNOSIS — I2510 Atherosclerotic heart disease of native coronary artery without angina pectoris: Secondary | ICD-10-CM | POA: Insufficient documentation

## 2010-12-06 DIAGNOSIS — E039 Hypothyroidism, unspecified: Secondary | ICD-10-CM | POA: Insufficient documentation

## 2010-12-06 DIAGNOSIS — I447 Left bundle-branch block, unspecified: Secondary | ICD-10-CM | POA: Insufficient documentation

## 2010-12-08 ENCOUNTER — Encounter (HOSPITAL_COMMUNITY): Payer: Medicare Other

## 2010-12-11 ENCOUNTER — Encounter (HOSPITAL_COMMUNITY): Payer: Medicare Other

## 2010-12-13 ENCOUNTER — Encounter (HOSPITAL_COMMUNITY): Payer: Medicare Other

## 2010-12-15 ENCOUNTER — Encounter (HOSPITAL_COMMUNITY): Payer: Medicare Other

## 2010-12-18 ENCOUNTER — Encounter (HOSPITAL_COMMUNITY): Payer: Medicare Other

## 2010-12-20 ENCOUNTER — Encounter (HOSPITAL_COMMUNITY): Payer: Medicare Other

## 2010-12-22 ENCOUNTER — Encounter (HOSPITAL_COMMUNITY): Payer: Medicare Other

## 2010-12-25 ENCOUNTER — Encounter (HOSPITAL_COMMUNITY): Payer: Medicare Other

## 2010-12-27 ENCOUNTER — Encounter (HOSPITAL_COMMUNITY): Payer: Medicare Other

## 2010-12-29 ENCOUNTER — Encounter (HOSPITAL_COMMUNITY): Payer: Medicare Other

## 2011-01-01 ENCOUNTER — Encounter (HOSPITAL_COMMUNITY): Payer: Medicare Other

## 2011-01-03 ENCOUNTER — Encounter (HOSPITAL_COMMUNITY): Payer: Medicare Other

## 2011-01-05 ENCOUNTER — Encounter (HOSPITAL_COMMUNITY): Payer: Medicare Other | Attending: Internal Medicine

## 2011-01-05 DIAGNOSIS — I2510 Atherosclerotic heart disease of native coronary artery without angina pectoris: Secondary | ICD-10-CM | POA: Insufficient documentation

## 2011-01-05 DIAGNOSIS — Z5189 Encounter for other specified aftercare: Secondary | ICD-10-CM | POA: Insufficient documentation

## 2011-01-05 DIAGNOSIS — I10 Essential (primary) hypertension: Secondary | ICD-10-CM | POA: Insufficient documentation

## 2011-01-05 DIAGNOSIS — I447 Left bundle-branch block, unspecified: Secondary | ICD-10-CM | POA: Insufficient documentation

## 2011-01-05 DIAGNOSIS — Z9861 Coronary angioplasty status: Secondary | ICD-10-CM | POA: Insufficient documentation

## 2011-01-05 DIAGNOSIS — E039 Hypothyroidism, unspecified: Secondary | ICD-10-CM | POA: Insufficient documentation

## 2011-01-05 DIAGNOSIS — I252 Old myocardial infarction: Secondary | ICD-10-CM | POA: Insufficient documentation

## 2011-01-08 ENCOUNTER — Encounter (HOSPITAL_COMMUNITY): Payer: Medicare Other

## 2011-01-10 ENCOUNTER — Encounter (HOSPITAL_COMMUNITY): Payer: Medicare Other

## 2011-01-12 ENCOUNTER — Encounter (HOSPITAL_COMMUNITY): Payer: Medicare Other

## 2011-01-15 ENCOUNTER — Encounter (HOSPITAL_COMMUNITY): Payer: Medicare Other

## 2011-01-17 ENCOUNTER — Encounter (HOSPITAL_COMMUNITY): Payer: Medicare Other

## 2011-01-18 ENCOUNTER — Telehealth: Payer: Self-pay | Admitting: Internal Medicine

## 2011-01-18 NOTE — Telephone Encounter (Signed)
[name] was notified that the order is here for Dr Haroldine Laws to sign.

## 2011-01-18 NOTE — Telephone Encounter (Signed)
Pt wants to continue and renew cardiac Rehab because it is helping her and Dr. Dustin Folks office would like a call back to discuss this

## 2011-01-18 NOTE — Telephone Encounter (Signed)
N/A.  LMTC. 

## 2011-01-19 ENCOUNTER — Encounter (HOSPITAL_COMMUNITY): Payer: Medicare Other

## 2011-02-06 ENCOUNTER — Other Ambulatory Visit: Payer: Self-pay | Admitting: Cardiology

## 2011-03-06 ENCOUNTER — Encounter (HOSPITAL_COMMUNITY): Payer: Self-pay

## 2011-03-07 ENCOUNTER — Encounter (HOSPITAL_COMMUNITY): Payer: Self-pay

## 2011-03-08 ENCOUNTER — Encounter (HOSPITAL_COMMUNITY): Payer: Self-pay

## 2011-03-13 ENCOUNTER — Encounter (HOSPITAL_COMMUNITY): Payer: Self-pay | Attending: Family Medicine

## 2011-03-13 ENCOUNTER — Telehealth: Payer: Self-pay

## 2011-03-13 NOTE — Telephone Encounter (Signed)
30 days minimum. So if she is having problems with it can stop now. Otherwise can continue until she sees [place].

## 2011-03-13 NOTE — Telephone Encounter (Signed)
Dr Sandi Mariscal wants to know how long [name] needs to stay on Plavix.  She had a bare-metal stent by Dr Martinique on 07/03/10 but follows with Dr Haroldine Laws.

## 2011-03-14 ENCOUNTER — Encounter (HOSPITAL_COMMUNITY): Payer: Self-pay

## 2011-03-14 NOTE — Telephone Encounter (Signed)
Dr Blomgren's office was notified.

## 2011-03-15 ENCOUNTER — Encounter (HOSPITAL_COMMUNITY): Payer: Self-pay

## 2011-03-20 ENCOUNTER — Encounter (HOSPITAL_COMMUNITY): Payer: Self-pay

## 2011-03-21 ENCOUNTER — Encounter (HOSPITAL_COMMUNITY): Payer: Self-pay

## 2011-03-22 ENCOUNTER — Encounter (HOSPITAL_COMMUNITY): Payer: Self-pay

## 2011-03-27 ENCOUNTER — Encounter (HOSPITAL_COMMUNITY): Payer: Self-pay

## 2011-03-28 ENCOUNTER — Encounter (HOSPITAL_COMMUNITY): Payer: Self-pay

## 2011-03-29 ENCOUNTER — Encounter (HOSPITAL_COMMUNITY): Payer: Self-pay

## 2011-04-03 ENCOUNTER — Encounter (HOSPITAL_COMMUNITY): Payer: Self-pay

## 2011-04-04 ENCOUNTER — Encounter (HOSPITAL_COMMUNITY): Payer: Self-pay

## 2011-04-04 ENCOUNTER — Other Ambulatory Visit: Payer: Medicare Other

## 2011-04-05 ENCOUNTER — Encounter (HOSPITAL_COMMUNITY): Payer: Self-pay

## 2011-04-09 ENCOUNTER — Encounter (HOSPITAL_COMMUNITY)
Admission: RE | Admit: 2011-04-09 | Discharge: 2011-04-09 | Disposition: A | Discharge status: Home / Self Care | Payer: Self-pay | Source: Ambulatory Visit | Attending: Internal Medicine | Admitting: Internal Medicine

## 2011-04-09 DIAGNOSIS — Z5189 Encounter for other specified aftercare: Secondary | ICD-10-CM | POA: Insufficient documentation

## 2011-04-09 DIAGNOSIS — I252 Old myocardial infarction: Secondary | ICD-10-CM | POA: Insufficient documentation

## 2011-04-09 DIAGNOSIS — E039 Hypothyroidism, unspecified: Secondary | ICD-10-CM | POA: Insufficient documentation

## 2011-04-09 DIAGNOSIS — I2510 Atherosclerotic heart disease of native coronary artery without angina pectoris: Secondary | ICD-10-CM | POA: Insufficient documentation

## 2011-04-09 DIAGNOSIS — Z9861 Coronary angioplasty status: Secondary | ICD-10-CM | POA: Insufficient documentation

## 2011-04-09 DIAGNOSIS — I447 Left bundle-branch block, unspecified: Secondary | ICD-10-CM | POA: Insufficient documentation

## 2011-04-09 DIAGNOSIS — I10 Essential (primary) hypertension: Secondary | ICD-10-CM | POA: Insufficient documentation

## 2011-04-10 ENCOUNTER — Encounter (HOSPITAL_COMMUNITY)
Admission: RE | Admit: 2011-04-10 | Discharge: 2011-04-10 | Disposition: A | Discharge status: Home / Self Care | Payer: Self-pay | Source: Ambulatory Visit | Attending: Internal Medicine | Admitting: Internal Medicine

## 2011-04-11 ENCOUNTER — Encounter (HOSPITAL_COMMUNITY): Payer: Self-pay

## 2011-04-12 ENCOUNTER — Encounter (HOSPITAL_COMMUNITY)
Admission: RE | Admit: 2011-04-12 | Discharge: 2011-04-12 | Disposition: A | Discharge status: Home / Self Care | Payer: Self-pay | Source: Ambulatory Visit | Attending: Internal Medicine | Admitting: Internal Medicine

## 2011-04-16 ENCOUNTER — Encounter (HOSPITAL_COMMUNITY)
Admission: RE | Admit: 2011-04-16 | Discharge: 2011-04-16 | Disposition: A | Discharge status: Home / Self Care | Payer: Self-pay | Source: Ambulatory Visit | Attending: Internal Medicine | Admitting: Internal Medicine

## 2011-04-17 ENCOUNTER — Encounter (HOSPITAL_COMMUNITY)
Admission: RE | Admit: 2011-04-17 | Discharge: 2011-04-17 | Disposition: A | Discharge status: Home / Self Care | Payer: Self-pay | Source: Ambulatory Visit | Attending: Internal Medicine | Admitting: Internal Medicine

## 2011-04-18 ENCOUNTER — Encounter (HOSPITAL_COMMUNITY): Payer: Self-pay

## 2011-04-19 ENCOUNTER — Encounter (HOSPITAL_COMMUNITY)
Admission: RE | Admit: 2011-04-19 | Discharge: 2011-04-19 | Disposition: A | Discharge status: Home / Self Care | Payer: Self-pay | Source: Ambulatory Visit | Attending: Internal Medicine | Admitting: Internal Medicine

## 2011-04-23 ENCOUNTER — Encounter (HOSPITAL_COMMUNITY)
Admission: RE | Admit: 2011-04-23 | Discharge: 2011-04-23 | Disposition: A | Discharge status: Home / Self Care | Payer: Self-pay | Source: Ambulatory Visit | Attending: Internal Medicine | Admitting: Internal Medicine

## 2011-04-24 ENCOUNTER — Encounter (HOSPITAL_COMMUNITY)
Admission: RE | Admit: 2011-04-24 | Discharge: 2011-04-24 | Disposition: A | Discharge status: Home / Self Care | Payer: Self-pay | Source: Ambulatory Visit | Attending: Internal Medicine | Admitting: Internal Medicine

## 2011-04-25 ENCOUNTER — Encounter (HOSPITAL_COMMUNITY): Payer: Self-pay

## 2011-04-26 ENCOUNTER — Other Ambulatory Visit: Payer: Medicare Other

## 2011-04-26 ENCOUNTER — Encounter (HOSPITAL_COMMUNITY): Payer: Self-pay

## 2011-04-30 ENCOUNTER — Encounter (HOSPITAL_COMMUNITY)
Admission: RE | Admit: 2011-04-30 | Discharge: 2011-04-30 | Disposition: A | Discharge status: Home / Self Care | Payer: Self-pay | Source: Ambulatory Visit | Attending: Internal Medicine | Admitting: Internal Medicine

## 2011-05-01 ENCOUNTER — Other Ambulatory Visit (INDEPENDENT_AMBULATORY_CARE_PROVIDER_SITE_OTHER): Payer: Medicare Other | Admitting: *Deleted

## 2011-05-01 ENCOUNTER — Encounter (HOSPITAL_COMMUNITY)
Admission: RE | Admit: 2011-05-01 | Discharge: 2011-05-01 | Disposition: A | Discharge status: Home / Self Care | Payer: Self-pay | Source: Ambulatory Visit | Attending: Internal Medicine | Admitting: Internal Medicine

## 2011-05-01 ENCOUNTER — Encounter (HOSPITAL_COMMUNITY): Payer: Self-pay

## 2011-05-01 DIAGNOSIS — R0989 Other specified symptoms and signs involving the circulatory and respiratory systems: Secondary | ICD-10-CM

## 2011-05-02 ENCOUNTER — Encounter (HOSPITAL_COMMUNITY): Payer: Self-pay

## 2011-05-03 ENCOUNTER — Encounter (HOSPITAL_COMMUNITY)
Admission: RE | Admit: 2011-05-03 | Discharge: 2011-05-03 | Disposition: A | Discharge status: Home / Self Care | Payer: Self-pay | Source: Ambulatory Visit | Attending: Internal Medicine | Admitting: Internal Medicine

## 2011-05-03 ENCOUNTER — Encounter (HOSPITAL_COMMUNITY): Payer: Self-pay

## 2011-05-07 ENCOUNTER — Encounter (HOSPITAL_COMMUNITY)
Admission: RE | Admit: 2011-05-07 | Discharge: 2011-05-07 | Disposition: A | Discharge status: Home / Self Care | Payer: Self-pay | Source: Ambulatory Visit | Attending: Internal Medicine | Admitting: Internal Medicine

## 2011-05-07 DIAGNOSIS — Z9861 Coronary angioplasty status: Secondary | ICD-10-CM | POA: Insufficient documentation

## 2011-05-07 DIAGNOSIS — I447 Left bundle-branch block, unspecified: Secondary | ICD-10-CM | POA: Insufficient documentation

## 2011-05-07 DIAGNOSIS — I10 Essential (primary) hypertension: Secondary | ICD-10-CM | POA: Insufficient documentation

## 2011-05-07 DIAGNOSIS — I252 Old myocardial infarction: Secondary | ICD-10-CM | POA: Insufficient documentation

## 2011-05-07 DIAGNOSIS — E039 Hypothyroidism, unspecified: Secondary | ICD-10-CM | POA: Insufficient documentation

## 2011-05-07 DIAGNOSIS — I2510 Atherosclerotic heart disease of native coronary artery without angina pectoris: Secondary | ICD-10-CM | POA: Insufficient documentation

## 2011-05-07 DIAGNOSIS — Z5189 Encounter for other specified aftercare: Secondary | ICD-10-CM | POA: Insufficient documentation

## 2011-05-08 ENCOUNTER — Encounter (HOSPITAL_COMMUNITY): Payer: Self-pay

## 2011-05-08 ENCOUNTER — Encounter (HOSPITAL_COMMUNITY)
Admission: RE | Admit: 2011-05-08 | Discharge: 2011-05-08 | Disposition: A | Discharge status: Home / Self Care | Payer: Self-pay | Source: Ambulatory Visit | Attending: Internal Medicine | Admitting: Internal Medicine

## 2011-05-09 ENCOUNTER — Encounter (HOSPITAL_COMMUNITY): Payer: Self-pay

## 2011-05-10 ENCOUNTER — Encounter (HOSPITAL_COMMUNITY)
Admission: RE | Admit: 2011-05-10 | Discharge: 2011-05-10 | Disposition: A | Discharge status: Home / Self Care | Payer: Self-pay | Source: Ambulatory Visit | Attending: Internal Medicine | Admitting: Internal Medicine

## 2011-05-10 ENCOUNTER — Encounter (HOSPITAL_COMMUNITY): Payer: Self-pay

## 2011-05-10 NOTE — Procedures (Unsigned)
CAROTID DUPLEX EXAM  INDICATION:  Right carotid bruits.  HISTORY: Diabetes:  No Cardiac:  No Hypertension:  Yes Smoking:  No Previous Surgery:  No CV History:  No Amaurosis Fugax No, Paresthesias No, Hemiparesis No                                      RIGHT             LEFT Brachial systolic pressure:         118               120 Brachial Doppler waveforms:         Normal            Normal Vertebral direction of flow:        Antegrade         Antegrade DUPLEX VELOCITIES (cm/sec) CCA peak systolic                   69                59 ECA peak systolic                   54                57 ICA peak systolic                   55                75 ICA end diastolic                   23                26 PLAQUE MORPHOLOGY:                  Intimal wall thickening             Intimal wall thickening PLAQUE AMOUNT: PLAQUE LOCATION:                    CCA               CCA  IMPRESSION: 1. No hemodynamically significant stenosis of the right or left ICA. 2. Antegrade vertebral arteries bilaterally.  ___________________________________________ Jessy Oto Fields, MD  EM/MEDQ  D:  05/02/2011  T:  05/02/2011  Job:  [id number]

## 2011-05-14 ENCOUNTER — Encounter (HOSPITAL_COMMUNITY): Payer: Self-pay

## 2011-05-15 ENCOUNTER — Encounter (HOSPITAL_COMMUNITY): Payer: Self-pay

## 2011-05-16 ENCOUNTER — Encounter (HOSPITAL_COMMUNITY): Payer: Self-pay

## 2011-05-17 ENCOUNTER — Encounter (HOSPITAL_COMMUNITY): Payer: Self-pay

## 2011-05-17 ENCOUNTER — Encounter (HOSPITAL_COMMUNITY)
Admission: RE | Admit: 2011-05-17 | Discharge: 2011-05-17 | Disposition: A | Discharge status: Home / Self Care | Payer: Self-pay | Source: Ambulatory Visit | Attending: Internal Medicine | Admitting: Internal Medicine

## 2011-05-21 ENCOUNTER — Encounter (HOSPITAL_COMMUNITY)
Admission: RE | Admit: 2011-05-21 | Discharge: 2011-05-21 | Disposition: A | Discharge status: Home / Self Care | Payer: Self-pay | Source: Ambulatory Visit | Attending: Internal Medicine | Admitting: Internal Medicine

## 2011-05-22 ENCOUNTER — Encounter (HOSPITAL_COMMUNITY): Payer: Self-pay

## 2011-05-22 ENCOUNTER — Encounter (HOSPITAL_COMMUNITY)
Admission: RE | Admit: 2011-05-22 | Discharge: 2011-05-22 | Disposition: A | Discharge status: Home / Self Care | Payer: Self-pay | Source: Ambulatory Visit | Attending: Internal Medicine | Admitting: Internal Medicine

## 2011-05-22 ENCOUNTER — Telehealth: Payer: Self-pay | Admitting: *Deleted

## 2011-05-22 ENCOUNTER — Encounter: Payer: Self-pay | Admitting: *Deleted

## 2011-05-22 NOTE — Telephone Encounter (Signed)
Pt is scheduled for screening colonoscopy w/ Dr. Olevia Perches on 2/27.  Pt is taking Plavix and has not seen Dr. Olevia Perches before.  Pt scheduled for new pt appointment with Dr. Olevia Perches on 2/26 at 2:15.  Previsit scheduled for 2/20 and colonoscopy scheduled for 2/27 cancelled.  Pt aware of appointments. Ulice Dash

## 2011-05-23 ENCOUNTER — Encounter (HOSPITAL_COMMUNITY): Payer: Self-pay

## 2011-05-24 ENCOUNTER — Encounter (HOSPITAL_COMMUNITY): Payer: Self-pay

## 2011-05-24 ENCOUNTER — Encounter (HOSPITAL_COMMUNITY)
Admission: RE | Admit: 2011-05-24 | Discharge: 2011-05-24 | Disposition: A | Discharge status: Home / Self Care | Payer: Self-pay | Source: Ambulatory Visit | Attending: Internal Medicine | Admitting: Internal Medicine

## 2011-05-28 ENCOUNTER — Encounter (HOSPITAL_COMMUNITY)
Admission: RE | Admit: 2011-05-28 | Discharge: 2011-05-28 | Disposition: A | Discharge status: Home / Self Care | Payer: Self-pay | Source: Ambulatory Visit | Attending: Internal Medicine | Admitting: Internal Medicine

## 2011-05-28 NOTE — Progress Notes (Deleted)
edit

## 2011-05-29 ENCOUNTER — Ambulatory Visit (INDEPENDENT_AMBULATORY_CARE_PROVIDER_SITE_OTHER): Payer: Medicare Other | Admitting: Internal Medicine

## 2011-05-29 ENCOUNTER — Encounter (HOSPITAL_COMMUNITY): Payer: Self-pay

## 2011-05-29 ENCOUNTER — Encounter (HOSPITAL_COMMUNITY)
Admission: RE | Admit: 2011-05-29 | Discharge: 2011-05-29 | Disposition: A | Discharge status: Home / Self Care | Payer: Self-pay | Source: Ambulatory Visit | Attending: Internal Medicine | Admitting: Internal Medicine

## 2011-05-29 ENCOUNTER — Telehealth: Payer: Self-pay | Admitting: Internal Medicine

## 2011-05-29 ENCOUNTER — Encounter: Payer: Self-pay | Admitting: Internal Medicine

## 2011-05-29 VITALS — BP 136/80 | HR 60 | Ht <= 58 in | Wt 169.0 lb

## 2011-05-29 DIAGNOSIS — D689 Coagulation defect, unspecified: Secondary | ICD-10-CM

## 2011-05-29 DIAGNOSIS — Z86010 Personal history of colonic polyps: Secondary | ICD-10-CM

## 2011-05-29 NOTE — Patient Instructions (Addendum)
We are going to hold off on your colonoscopy at this time until you have an appointment with Dr Haroldine Laws and come up with a plan regarding your Plavix. Per Dr Bensimhon's office, you will be due for an appointment with him in June 2013. We will call you regarding colonoscopy at that time. CC: Dr Derinda Late, Dr Haroldine Laws

## 2011-05-29 NOTE — Progress Notes (Signed)
[name] [birth year] MRN [id number]    History of Present Illness:  This is a 78 year old African American female who is here to discuss a colorectal screening. She is asymptomatic. She is having regular bowel habits and denies rectal bleeding. There is no family history of colon cancer. She is on Plavix 75 mg daily which was started in April 2012 after a non ST elevation MI and placement of a bare-metal stent. Her ejection fraction prior to the MI was 65%. She has been followed by Dr. Haroldine Laws and thought she was scheduled for an appointment with him in April to decide if she could be taken off of Plavix. However, Dr Bensimhon's office states that patient is not due to see them until June 2013. Patient had a colonoscopy in [place], [place] more than 10 years ago. She was told to have a polyp. The record is not available.  Past Medical History  Diagnosis Date  . CAD (coronary artery disease)     a. s/p NSTEMI 06/30/10 tx with BMS to LAD;  b. cath 4/12: mLAD 80% (PCI), RCA 20%; EF 65-70%;  c. echo 1/11: EF 65%, mild MR  . DM2 (diabetes mellitus, type 2)   . HTN (hypertension)   . Hypothyroidism   . Dyslipidemia     Crestor started 4/12  . LBBB (left bundle branch block)   . Diverticulosis   . Myocardial infarction    Past Surgical History  Procedure Date  . Thyroidectomy   . Tubal ligation   . Abdominal hysterectomy     reports that she has never smoked. She has never used smokeless tobacco. She reports that she does not drink alcohol or use illicit drugs. family history includes Breast cancer in her sister; Heart attack in her father; Hypertension in her mother; Lung cancer in her sister; and Stroke in her mother.  There is no history of Colon cancer. No Known Allergies      Review of Systems: Negative for chest pain shortness of breath dysphagia odynophagia  The remainder of the 10 point ROS is negative except as outlined in H&P   Physical Exam: General appearance  Well  developed, in no distress. Eyes- non icteric. HEENT nontraumatic, normocephalic. Mouth no lesions, tongue papillated, no cheilosis. Neck supple without adenopathy, thyroid not enlarged, no carotid bruits, no JVD. Lungs Clear to auscultation bilaterally. Cor normal S1, normal S2, regular rhythm, no murmur,  quiet precordium. Abdomen: Soft nontender abdomen with normal active bowel sounds. No distention. Liver edge at costal margin. Rectal: Soft Hemoccult negative stool. Extremities no pedal edema. Skin no lesions. Neurological alert and oriented x 3. Psychological normal mood and affect.  Assessment and Plan:  Problem #11 78 year old [place] American female who is asymptomatic as far as GI symptoms are concerned. She is a good candidate for a screening colonoscopy. This would be her second exam. The one more than 10 years ago included polypectomy.  She has been on Plavix for almost a year  for a bare-metal stent. She is due to see Dr Sheilah Pigeon to decide if she can disontinue antiplatelet therapy 1 year since her MI. Once that is decided, she could have a colonoscopy either off Plavix or on Plavix. We have discussed both scenarios with the patient.It would be preferable for her to be  off Plavix and therefore I will wait until she sees Dr Haroldine Laws to actually schedule the colonoscopy.   05/29/2011 [name]

## 2011-05-29 NOTE — Telephone Encounter (Signed)
New problem:  Per [place], pt need upcoming colonoscopy. Please advise on plavix.

## 2011-05-30 ENCOUNTER — Encounter: Payer: Medicare Other | Admitting: Internal Medicine

## 2011-05-30 ENCOUNTER — Encounter (HOSPITAL_COMMUNITY): Payer: Self-pay

## 2011-05-30 NOTE — Telephone Encounter (Signed)
Thank You, Herma Mering

## 2011-05-30 NOTE — Telephone Encounter (Signed)
BMS to LAD 4/12.  Ok to hold Plavix for colonoscopy

## 2011-05-30 NOTE — Telephone Encounter (Signed)
OK to hold Plavix before colonoscopy

## 2011-05-30 NOTE — Telephone Encounter (Signed)
Dr Olevia Perches wants to know if it is ok with Dr Haroldine Laws for [name] to come off her Plavix 5 days prior to her colonoscopy?  Colonoscopy will not be scheduled until they receive an answer regarding the Plavix.

## 2011-05-31 ENCOUNTER — Encounter (HOSPITAL_COMMUNITY): Payer: Self-pay

## 2011-05-31 ENCOUNTER — Telehealth: Payer: Self-pay | Admitting: *Deleted

## 2011-05-31 ENCOUNTER — Encounter (HOSPITAL_COMMUNITY)
Admission: RE | Admit: 2011-05-31 | Discharge: 2011-05-31 | Disposition: A | Discharge status: Home / Self Care | Payer: Self-pay | Source: Ambulatory Visit | Attending: Internal Medicine | Admitting: Internal Medicine

## 2011-05-31 NOTE — Telephone Encounter (Signed)
Previsit and colonoscopy scheduled for patient. Previsit is for 07-11-11 and colonoscopy is scheduled for 07/26/11.

## 2011-05-31 NOTE — Telephone Encounter (Signed)
I have spoken to patient to advise her that Dr Haroldine Laws has okayed her holding her Plavix x 5 days for her colonoscopy. I have asked that she NOT discontinue Plavix on her own, but wait for our Previsit nurse to go over instructions with her when it gets closer to her colonoscopy date. I have advised patient that she is scheduled for a previsit on 07/11/11 @ 8:30 am and for colonoscopy on 07/26/11 @ 1:30 pm. Patient has written these dates down and verbalizes understanding of times, dates and locations of previsit and colonoscopy.

## 2011-06-04 ENCOUNTER — Encounter (HOSPITAL_COMMUNITY): Payer: Self-pay

## 2011-06-04 DIAGNOSIS — Z5189 Encounter for other specified aftercare: Secondary | ICD-10-CM | POA: Insufficient documentation

## 2011-06-04 DIAGNOSIS — I2510 Atherosclerotic heart disease of native coronary artery without angina pectoris: Secondary | ICD-10-CM | POA: Insufficient documentation

## 2011-06-04 DIAGNOSIS — E039 Hypothyroidism, unspecified: Secondary | ICD-10-CM | POA: Insufficient documentation

## 2011-06-04 DIAGNOSIS — Z9861 Coronary angioplasty status: Secondary | ICD-10-CM | POA: Insufficient documentation

## 2011-06-04 DIAGNOSIS — I447 Left bundle-branch block, unspecified: Secondary | ICD-10-CM | POA: Insufficient documentation

## 2011-06-04 DIAGNOSIS — I10 Essential (primary) hypertension: Secondary | ICD-10-CM | POA: Insufficient documentation

## 2011-06-04 DIAGNOSIS — I252 Old myocardial infarction: Secondary | ICD-10-CM | POA: Insufficient documentation

## 2011-06-05 ENCOUNTER — Encounter (HOSPITAL_COMMUNITY)
Admission: RE | Admit: 2011-06-05 | Discharge: 2011-06-05 | Disposition: A | Discharge status: Home / Self Care | Payer: Self-pay | Source: Ambulatory Visit | Attending: Internal Medicine | Admitting: Internal Medicine

## 2011-06-05 ENCOUNTER — Encounter (HOSPITAL_COMMUNITY): Payer: Self-pay

## 2011-06-06 ENCOUNTER — Encounter (HOSPITAL_COMMUNITY): Payer: Self-pay

## 2011-06-07 ENCOUNTER — Encounter (HOSPITAL_COMMUNITY)
Admission: RE | Admit: 2011-06-07 | Discharge: 2011-06-07 | Disposition: A | Discharge status: Home / Self Care | Payer: Self-pay | Source: Ambulatory Visit | Attending: Internal Medicine | Admitting: Internal Medicine

## 2011-06-07 ENCOUNTER — Encounter (HOSPITAL_COMMUNITY): Payer: Self-pay

## 2011-06-11 ENCOUNTER — Encounter (HOSPITAL_COMMUNITY)
Admission: RE | Admit: 2011-06-11 | Discharge: 2011-06-11 | Disposition: A | Discharge status: Home / Self Care | Payer: Self-pay | Source: Ambulatory Visit | Attending: Internal Medicine | Admitting: Internal Medicine

## 2011-06-12 ENCOUNTER — Encounter (HOSPITAL_COMMUNITY): Payer: Self-pay

## 2011-06-12 ENCOUNTER — Encounter (HOSPITAL_COMMUNITY)
Admission: RE | Admit: 2011-06-12 | Discharge: 2011-06-12 | Disposition: A | Discharge status: Home / Self Care | Payer: Self-pay | Source: Ambulatory Visit | Attending: Internal Medicine | Admitting: Internal Medicine

## 2011-06-13 ENCOUNTER — Encounter (HOSPITAL_COMMUNITY): Payer: Self-pay

## 2011-06-14 ENCOUNTER — Encounter (HOSPITAL_COMMUNITY)
Admission: RE | Admit: 2011-06-14 | Discharge: 2011-06-14 | Disposition: A | Discharge status: Home / Self Care | Payer: Self-pay | Source: Ambulatory Visit | Attending: Internal Medicine | Admitting: Internal Medicine

## 2011-06-14 ENCOUNTER — Encounter (HOSPITAL_COMMUNITY): Payer: Self-pay

## 2011-06-18 ENCOUNTER — Encounter (HOSPITAL_COMMUNITY): Payer: Self-pay

## 2011-06-19 ENCOUNTER — Encounter (HOSPITAL_COMMUNITY): Payer: Self-pay

## 2011-06-20 ENCOUNTER — Encounter (HOSPITAL_COMMUNITY): Payer: Self-pay

## 2011-06-21 ENCOUNTER — Encounter (HOSPITAL_COMMUNITY): Payer: Self-pay

## 2011-06-25 ENCOUNTER — Encounter (HOSPITAL_COMMUNITY): Payer: Self-pay

## 2011-06-26 ENCOUNTER — Encounter (HOSPITAL_COMMUNITY): Payer: Self-pay

## 2011-06-27 ENCOUNTER — Encounter (HOSPITAL_COMMUNITY): Payer: Self-pay

## 2011-06-28 ENCOUNTER — Encounter (HOSPITAL_COMMUNITY)
Admission: RE | Admit: 2011-06-28 | Discharge: 2011-06-28 | Disposition: A | Discharge status: Home / Self Care | Payer: Self-pay | Source: Ambulatory Visit | Attending: Internal Medicine | Admitting: Internal Medicine

## 2011-06-28 ENCOUNTER — Encounter (HOSPITAL_COMMUNITY): Payer: Self-pay

## 2011-06-29 ENCOUNTER — Encounter (HOSPITAL_COMMUNITY)
Admission: RE | Admit: 2011-06-29 | Discharge: 2011-06-29 | Disposition: A | Discharge status: Home / Self Care | Payer: Self-pay | Source: Ambulatory Visit | Attending: Internal Medicine | Admitting: Internal Medicine

## 2011-07-02 ENCOUNTER — Encounter (HOSPITAL_COMMUNITY): Payer: Self-pay

## 2011-07-02 DIAGNOSIS — I447 Left bundle-branch block, unspecified: Secondary | ICD-10-CM | POA: Insufficient documentation

## 2011-07-02 DIAGNOSIS — Z9861 Coronary angioplasty status: Secondary | ICD-10-CM | POA: Insufficient documentation

## 2011-07-02 DIAGNOSIS — E039 Hypothyroidism, unspecified: Secondary | ICD-10-CM | POA: Insufficient documentation

## 2011-07-02 DIAGNOSIS — Z5189 Encounter for other specified aftercare: Secondary | ICD-10-CM | POA: Insufficient documentation

## 2011-07-02 DIAGNOSIS — I10 Essential (primary) hypertension: Secondary | ICD-10-CM | POA: Insufficient documentation

## 2011-07-02 DIAGNOSIS — I2510 Atherosclerotic heart disease of native coronary artery without angina pectoris: Secondary | ICD-10-CM | POA: Insufficient documentation

## 2011-07-02 DIAGNOSIS — I252 Old myocardial infarction: Secondary | ICD-10-CM | POA: Insufficient documentation

## 2011-07-03 ENCOUNTER — Encounter (HOSPITAL_COMMUNITY): Payer: Self-pay

## 2011-07-04 ENCOUNTER — Encounter (HOSPITAL_COMMUNITY): Payer: Self-pay

## 2011-07-05 ENCOUNTER — Encounter (HOSPITAL_COMMUNITY): Payer: Self-pay

## 2011-07-09 ENCOUNTER — Encounter (HOSPITAL_COMMUNITY): Payer: Self-pay

## 2011-07-10 ENCOUNTER — Encounter (HOSPITAL_COMMUNITY)
Admission: RE | Admit: 2011-07-10 | Discharge: 2011-07-10 | Disposition: A | Discharge status: Home / Self Care | Payer: Self-pay | Source: Ambulatory Visit | Attending: Internal Medicine | Admitting: Internal Medicine

## 2011-07-10 ENCOUNTER — Encounter (HOSPITAL_COMMUNITY): Payer: Self-pay

## 2011-07-11 ENCOUNTER — Ambulatory Visit (AMBULATORY_SURGERY_CENTER): Payer: Medicare Other | Admitting: *Deleted

## 2011-07-11 ENCOUNTER — Encounter (HOSPITAL_COMMUNITY): Payer: Self-pay

## 2011-07-11 VITALS — Ht 59.0 in | Wt 170.8 lb

## 2011-07-11 DIAGNOSIS — Z1211 Encounter for screening for malignant neoplasm of colon: Secondary | ICD-10-CM

## 2011-07-11 MED ORDER — MOVIPREP 100 G PO SOLR: ORAL | Status: DC

## 2011-07-12 ENCOUNTER — Encounter (HOSPITAL_COMMUNITY)
Admission: RE | Admit: 2011-07-12 | Discharge: 2011-07-12 | Disposition: A | Discharge status: Home / Self Care | Payer: Self-pay | Source: Ambulatory Visit | Attending: Internal Medicine | Admitting: Internal Medicine

## 2011-07-12 ENCOUNTER — Encounter (HOSPITAL_COMMUNITY): Payer: Self-pay

## 2011-07-16 ENCOUNTER — Encounter (HOSPITAL_COMMUNITY)
Admission: RE | Admit: 2011-07-16 | Discharge: 2011-07-16 | Disposition: A | Discharge status: Home / Self Care | Payer: Self-pay | Source: Ambulatory Visit | Attending: Internal Medicine | Admitting: Internal Medicine

## 2011-07-17 ENCOUNTER — Encounter (HOSPITAL_COMMUNITY): Payer: Self-pay

## 2011-07-17 ENCOUNTER — Encounter (HOSPITAL_COMMUNITY)
Admission: RE | Admit: 2011-07-17 | Discharge: 2011-07-17 | Disposition: A | Discharge status: Home / Self Care | Payer: Self-pay | Source: Ambulatory Visit | Attending: Internal Medicine | Admitting: Internal Medicine

## 2011-07-18 ENCOUNTER — Encounter (HOSPITAL_COMMUNITY): Payer: Self-pay

## 2011-07-19 ENCOUNTER — Encounter (HOSPITAL_COMMUNITY): Payer: Self-pay

## 2011-07-23 ENCOUNTER — Encounter (HOSPITAL_COMMUNITY): Payer: Self-pay

## 2011-07-24 ENCOUNTER — Encounter (HOSPITAL_COMMUNITY)
Admission: RE | Admit: 2011-07-24 | Discharge: 2011-07-24 | Disposition: A | Discharge status: Home / Self Care | Payer: Self-pay | Source: Ambulatory Visit | Attending: Internal Medicine | Admitting: Internal Medicine

## 2011-07-24 ENCOUNTER — Encounter (HOSPITAL_COMMUNITY): Payer: Self-pay

## 2011-07-25 ENCOUNTER — Encounter (HOSPITAL_COMMUNITY): Payer: Self-pay

## 2011-07-26 ENCOUNTER — Encounter (HOSPITAL_COMMUNITY): Payer: Self-pay

## 2011-07-26 ENCOUNTER — Ambulatory Visit (AMBULATORY_SURGERY_CENTER): Payer: Medicare Other | Admitting: Internal Medicine

## 2011-07-26 ENCOUNTER — Encounter: Payer: Self-pay | Admitting: Internal Medicine

## 2011-07-26 VITALS — BP 131/75 | HR 62 | Temp 95.8°F | Resp 14 | Ht 59.0 in | Wt 170.0 lb

## 2011-07-26 DIAGNOSIS — Z1211 Encounter for screening for malignant neoplasm of colon: Secondary | ICD-10-CM

## 2011-07-26 DIAGNOSIS — Z860100 Personal history of colon polyps, unspecified: Secondary | ICD-10-CM

## 2011-07-26 LAB — GLUCOSE, CAPILLARY
Glucose-Capillary: 108 mg/dL — ABNORMAL HIGH (ref 70–99)
Glucose-Capillary: 114 mg/dL — ABNORMAL HIGH (ref 70–99)

## 2011-07-26 MED ORDER — SODIUM CHLORIDE 0.9 % IV SOLN: 500.0000 mL | INTRAVENOUS | Status: DC

## 2011-07-26 NOTE — Patient Instructions (Signed)
YOU HAD AN ENDOSCOPIC PROCEDURE TODAY AT THE [facility]: Refer to the procedure report that was given to you for any specific questions about what was found during the examination.  If the procedure report does not answer your questions, please call your gastroenterologist to clarify.  If you requested that your care partner not be given the details of your procedure findings, then the procedure report has been included in a sealed envelope for you to review at your convenience later.  YOU SHOULD EXPECT: Some feelings of bloating in the abdomen. Passage of more gas than usual.  Walking can help get rid of the air that was put into your GI tract during the procedure and reduce the bloating. If you had a lower endoscopy (such as a colonoscopy or flexible sigmoidoscopy) you may notice spotting of blood in your stool or on the toilet paper. If you underwent a bowel prep for your procedure, then you may not have a normal bowel movement for a few days.  DIET: Your first meal following the procedure should be a light meal and then it is ok to progress to your normal diet.  A half-sandwich or bowl of soup is an example of a good first meal.  Heavy or fried foods are harder to digest and may make you feel nauseous or bloated.  Likewise meals heavy in dairy and vegetables can cause extra gas to form and this can also increase the bloating.  Drink plenty of fluids but you should avoid alcoholic beverages for 24 hours.  ACTIVITY: Your care partner should take you home directly after the procedure.  You should plan to take it easy, moving slowly for the rest of the day.  You can resume normal activity the day after the procedure however you should NOT DRIVE or use heavy machinery for 24 hours (because of the sedation medicines used during the test).    SYMPTOMS TO REPORT IMMEDIATELY: A gastroenterologist can be reached at any hour.  During normal business hours, 8:30 AM to 5:00 PM Monday through Friday,  call [phone].  After hours and on weekends, please call the GI answering service at [phone] who will take a message and have the physician on call contact you.   Following lower endoscopy (colonoscopy or flexible sigmoidoscopy):  Excessive amounts of blood in the stool  Significant tenderness or worsening of abdominal pains  Swelling of the abdomen that is new, acute  Fever of 100F or higher    FOLLOW UP: If any biopsies were taken you will be contacted by phone or by letter within the next 1-3 weeks.  Call your gastroenterologist if you have not heard about the biopsies in 3 weeks.  Our staff will call the home number listed on your records the next business day following your procedure to check on you and address any questions or concerns that you may have at that time regarding the information given to you following your procedure. This is a courtesy call and so if there is no answer at the home number and we have not heard from you through the emergency physician on call, we will assume that you have returned to your regular daily activities without incident.  SIGNATURES/CONFIDENTIALITY: You and/or your care partner have signed paperwork which will be entered into your electronic medical record.  These signatures attest to the fact that that the information above on your After Visit Summary has been reviewed and is understood.  Full responsibility of the confidentiality   of this discharge information lies with you and/or your care-partner.     

## 2011-07-26 NOTE — Progress Notes (Signed)
Patient did not have preoperative order for IV antibiotic SSI prophylaxis. (G8918)  Patient did not experience any of the following events: a burn prior to discharge; a fall within the facility; wrong site/side/patient/procedure/implant event; or a hospital transfer or hospital admission upon discharge from the facility. (G8907)  

## 2011-07-26 NOTE — Op Note (Addendum)
[facility] [address]. [place], [place]  [id number]  COLONOSCOPY PROCEDURE REPORT  PATIENT:  [name], [name]  MR#:  [id number] BIRTHDATE:  1932-03-24, 79 yrs. old  GENDER:  female ENDOSCOPIST:  Lowella Bandy. Olevia Perches, MD REF. BY:  Derinda Late, M.D. PROCEDURE DATE:  07/26/2011 PROCEDURE:  Colonoscopy [id number] ASA CLASS:  Class II INDICATIONS:  history of polyps last colon more than 10 years ago in [place], she was told to have a polyp Plavix held since 07/21/2011 MEDICATIONS:   MAC sedation, administered by CRNA, propofol (Diprivan) 120 mg  DESCRIPTION OF PROCEDURE:   After the risks and benefits and of the procedure were explained, informed consent was obtained. Digital rectal exam was performed and revealed no rectal masses. The LB [id number]-[id number] endoscope was introduced through the anus and advanced to the cecum, which was identified by both the appendix and ileocecal valve.  The quality of the prep was good, using MoviPrep.  The instrument was then slowly withdrawn as the colon was fully examined. <<PROCEDUREIMAGES>>  FINDINGS:  Internal Hemorrhoids were found (see image4).  This was otherwise a normal examination of the colon (see image3, image2, and image1).   Retroflexed views in the rectum revealed no abnormalities.    The scope was then withdrawn from the patient and the procedure completed.  COMPLICATIONS:  None ENDOSCOPIC IMPRESSION: 1) Internal hemorrhoids 2) Otherwise normal examination RECOMMENDATIONS: 1) High fiber diet. Resume Plavix today  REPEAT EXAM:  In 10 year(s) for.  ______________________________ Lowella Bandy. Olevia Perches, MD  CC:  n. REVISED:  07/26/2011 02:11 PM eSIGNED:   Lowella Bandy. Aalia Greulich at 07/26/2011 02:11 PM  Rogelia Boga, [id number]

## 2011-07-27 ENCOUNTER — Telehealth: Payer: Self-pay | Admitting: *Deleted

## 2011-07-27 NOTE — Telephone Encounter (Signed)
No answer or answering machine to leave message.

## 2011-07-30 ENCOUNTER — Encounter (HOSPITAL_COMMUNITY): Payer: Self-pay

## 2011-07-31 ENCOUNTER — Encounter (HOSPITAL_COMMUNITY)
Admission: RE | Admit: 2011-07-31 | Discharge: 2011-07-31 | Disposition: A | Discharge status: Home / Self Care | Payer: Self-pay | Source: Ambulatory Visit | Attending: Internal Medicine | Admitting: Internal Medicine

## 2011-07-31 ENCOUNTER — Encounter (HOSPITAL_COMMUNITY): Payer: Self-pay

## 2011-08-01 ENCOUNTER — Encounter (HOSPITAL_COMMUNITY): Payer: Medicare Other

## 2011-08-02 ENCOUNTER — Encounter (HOSPITAL_COMMUNITY): Payer: Medicare Other

## 2011-08-02 ENCOUNTER — Encounter (HOSPITAL_COMMUNITY)
Admission: RE | Admit: 2011-08-02 | Discharge: 2011-08-02 | Disposition: A | Discharge status: Home / Self Care | Payer: Self-pay | Source: Ambulatory Visit | Attending: Internal Medicine | Admitting: Internal Medicine

## 2011-08-02 DIAGNOSIS — I252 Old myocardial infarction: Secondary | ICD-10-CM | POA: Insufficient documentation

## 2011-08-02 DIAGNOSIS — Z9861 Coronary angioplasty status: Secondary | ICD-10-CM | POA: Insufficient documentation

## 2011-08-02 DIAGNOSIS — E039 Hypothyroidism, unspecified: Secondary | ICD-10-CM | POA: Insufficient documentation

## 2011-08-02 DIAGNOSIS — I447 Left bundle-branch block, unspecified: Secondary | ICD-10-CM | POA: Insufficient documentation

## 2011-08-02 DIAGNOSIS — I2510 Atherosclerotic heart disease of native coronary artery without angina pectoris: Secondary | ICD-10-CM | POA: Insufficient documentation

## 2011-08-02 DIAGNOSIS — I10 Essential (primary) hypertension: Secondary | ICD-10-CM | POA: Insufficient documentation

## 2011-08-02 DIAGNOSIS — Z5189 Encounter for other specified aftercare: Secondary | ICD-10-CM | POA: Insufficient documentation

## 2011-08-06 ENCOUNTER — Encounter (HOSPITAL_COMMUNITY): Payer: Self-pay

## 2011-08-07 ENCOUNTER — Encounter (HOSPITAL_COMMUNITY): Payer: Self-pay

## 2011-08-07 ENCOUNTER — Encounter (HOSPITAL_COMMUNITY): Payer: Medicare Other

## 2011-08-08 ENCOUNTER — Encounter (HOSPITAL_COMMUNITY): Payer: Medicare Other

## 2011-08-09 ENCOUNTER — Encounter (HOSPITAL_COMMUNITY): Payer: Medicare Other

## 2011-08-09 ENCOUNTER — Encounter (HOSPITAL_COMMUNITY)
Admission: RE | Admit: 2011-08-09 | Discharge: 2011-08-09 | Disposition: A | Discharge status: Home / Self Care | Payer: Self-pay | Source: Ambulatory Visit | Attending: Internal Medicine | Admitting: Internal Medicine

## 2011-08-13 ENCOUNTER — Encounter (HOSPITAL_COMMUNITY)
Admission: RE | Admit: 2011-08-13 | Discharge: 2011-08-13 | Disposition: A | Discharge status: Home / Self Care | Payer: Self-pay | Source: Ambulatory Visit | Attending: Internal Medicine | Admitting: Internal Medicine

## 2011-08-14 ENCOUNTER — Encounter (HOSPITAL_COMMUNITY)
Admission: RE | Admit: 2011-08-14 | Discharge: 2011-08-14 | Disposition: A | Discharge status: Home / Self Care | Payer: Self-pay | Source: Ambulatory Visit | Attending: Internal Medicine | Admitting: Internal Medicine

## 2011-08-14 ENCOUNTER — Encounter (HOSPITAL_COMMUNITY): Payer: Medicare Other

## 2011-08-15 ENCOUNTER — Encounter (HOSPITAL_COMMUNITY): Payer: Medicare Other

## 2011-08-16 ENCOUNTER — Encounter (HOSPITAL_COMMUNITY): Payer: Medicare Other

## 2011-08-16 ENCOUNTER — Encounter (HOSPITAL_COMMUNITY): Payer: Self-pay

## 2011-08-20 ENCOUNTER — Encounter (HOSPITAL_COMMUNITY)
Admission: RE | Admit: 2011-08-20 | Discharge: 2011-08-20 | Disposition: A | Discharge status: Home / Self Care | Payer: Self-pay | Source: Ambulatory Visit | Attending: Internal Medicine | Admitting: Internal Medicine

## 2011-08-21 ENCOUNTER — Encounter (HOSPITAL_COMMUNITY): Payer: Self-pay

## 2011-08-21 ENCOUNTER — Encounter (HOSPITAL_COMMUNITY): Payer: Medicare Other

## 2011-08-22 ENCOUNTER — Encounter (HOSPITAL_COMMUNITY): Payer: Medicare Other

## 2011-08-23 ENCOUNTER — Encounter (HOSPITAL_COMMUNITY)
Admission: RE | Admit: 2011-08-23 | Discharge: 2011-08-23 | Disposition: A | Discharge status: Home / Self Care | Payer: Self-pay | Source: Ambulatory Visit | Attending: Internal Medicine | Admitting: Internal Medicine

## 2011-08-23 ENCOUNTER — Encounter (HOSPITAL_COMMUNITY): Payer: Medicare Other

## 2011-08-27 ENCOUNTER — Encounter (HOSPITAL_COMMUNITY): Payer: Self-pay

## 2011-08-28 ENCOUNTER — Encounter (HOSPITAL_COMMUNITY): Payer: Medicare Other

## 2011-08-28 ENCOUNTER — Encounter (HOSPITAL_COMMUNITY): Payer: Self-pay

## 2011-08-29 ENCOUNTER — Encounter (HOSPITAL_COMMUNITY): Payer: Medicare Other

## 2011-08-30 ENCOUNTER — Encounter (HOSPITAL_COMMUNITY): Payer: Medicare Other

## 2011-08-30 ENCOUNTER — Encounter (HOSPITAL_COMMUNITY): Payer: Self-pay

## 2011-08-31 ENCOUNTER — Other Ambulatory Visit: Payer: Self-pay | Admitting: Internal Medicine

## 2011-08-31 MED ORDER — CARVEDILOL 3.125 MG PO TABS: 3.1250 mg | ORAL_TABLET | Freq: Two times a day (BID) | ORAL | Status: DC

## 2011-09-03 ENCOUNTER — Encounter (HOSPITAL_COMMUNITY): Payer: Medicare Other

## 2011-09-03 DIAGNOSIS — I447 Left bundle-branch block, unspecified: Secondary | ICD-10-CM | POA: Insufficient documentation

## 2011-09-03 DIAGNOSIS — Z5189 Encounter for other specified aftercare: Secondary | ICD-10-CM | POA: Insufficient documentation

## 2011-09-03 DIAGNOSIS — I2510 Atherosclerotic heart disease of native coronary artery without angina pectoris: Secondary | ICD-10-CM | POA: Insufficient documentation

## 2011-09-03 DIAGNOSIS — I10 Essential (primary) hypertension: Secondary | ICD-10-CM | POA: Insufficient documentation

## 2011-09-03 DIAGNOSIS — E039 Hypothyroidism, unspecified: Secondary | ICD-10-CM | POA: Insufficient documentation

## 2011-09-03 DIAGNOSIS — I252 Old myocardial infarction: Secondary | ICD-10-CM | POA: Insufficient documentation

## 2011-09-03 DIAGNOSIS — Z9861 Coronary angioplasty status: Secondary | ICD-10-CM | POA: Insufficient documentation

## 2011-09-04 ENCOUNTER — Encounter (HOSPITAL_COMMUNITY): Payer: Medicare Other

## 2011-09-05 ENCOUNTER — Encounter (HOSPITAL_COMMUNITY): Payer: Medicare Other

## 2011-09-06 ENCOUNTER — Encounter (HOSPITAL_COMMUNITY): Payer: Medicare Other

## 2011-09-10 ENCOUNTER — Encounter (HOSPITAL_COMMUNITY): Payer: Medicare Other

## 2011-09-11 ENCOUNTER — Encounter (HOSPITAL_COMMUNITY): Payer: Medicare Other

## 2011-09-12 ENCOUNTER — Encounter (HOSPITAL_COMMUNITY): Payer: Medicare Other

## 2011-09-13 ENCOUNTER — Encounter (HOSPITAL_COMMUNITY): Payer: Medicare Other

## 2011-09-17 ENCOUNTER — Encounter (HOSPITAL_COMMUNITY): Payer: Medicare Other

## 2011-09-18 ENCOUNTER — Encounter (HOSPITAL_COMMUNITY): Payer: Medicare Other

## 2011-09-19 ENCOUNTER — Encounter (HOSPITAL_COMMUNITY): Payer: Medicare Other

## 2011-09-20 ENCOUNTER — Encounter (HOSPITAL_COMMUNITY): Payer: Medicare Other

## 2011-09-24 ENCOUNTER — Encounter (HOSPITAL_COMMUNITY)
Admission: RE | Admit: 2011-09-24 | Discharge: 2011-09-24 | Disposition: A | Discharge status: Home / Self Care | Payer: Medicare Other | Source: Ambulatory Visit | Attending: Internal Medicine | Admitting: Internal Medicine

## 2011-09-25 ENCOUNTER — Encounter (HOSPITAL_COMMUNITY)
Admission: RE | Admit: 2011-09-25 | Discharge: 2011-09-25 | Disposition: A | Discharge status: Home / Self Care | Payer: Medicare Other | Source: Ambulatory Visit | Attending: Internal Medicine | Admitting: Internal Medicine

## 2011-09-25 ENCOUNTER — Encounter (HOSPITAL_COMMUNITY): Payer: Medicare Other

## 2011-09-25 LAB — GLUCOSE, CAPILLARY: Glucose-Capillary: 130 mg/dL — ABNORMAL HIGH (ref 70–99)

## 2011-09-25 NOTE — Progress Notes (Signed)
After about 8 minutes on the treadmill, patient requested an blood sugar check. Per pt, she hasn't been checking CBG at home and was recently taken off Metformin because she felt tired, nauseous taking it. CBG was 130 mg/dL. Pt was encouraged to get a home CBG meter and to check as directed by her PCP.

## 2011-09-26 ENCOUNTER — Encounter (HOSPITAL_COMMUNITY): Payer: Medicare Other

## 2011-09-27 ENCOUNTER — Encounter (HOSPITAL_COMMUNITY)
Admission: RE | Admit: 2011-09-27 | Discharge: 2011-09-27 | Disposition: A | Discharge status: Home / Self Care | Payer: Medicare Other | Source: Ambulatory Visit | Attending: Internal Medicine | Admitting: Internal Medicine

## 2011-09-27 ENCOUNTER — Telehealth: Payer: Self-pay | Admitting: Internal Medicine

## 2011-09-27 ENCOUNTER — Encounter (HOSPITAL_COMMUNITY): Payer: Medicare Other

## 2011-09-27 NOTE — Telephone Encounter (Signed)
Spoke with pt, she requested to follow with dr bensimhon. Number to clinic given to pt.

## 2011-09-27 NOTE — Telephone Encounter (Signed)
New msg Pt wants to know should she make appt with Dr Haroldine Laws at [facility] clinic of another provider

## 2011-10-01 ENCOUNTER — Encounter (HOSPITAL_COMMUNITY): Payer: Medicare Other

## 2011-10-01 DIAGNOSIS — I252 Old myocardial infarction: Secondary | ICD-10-CM | POA: Insufficient documentation

## 2011-10-01 DIAGNOSIS — Z5189 Encounter for other specified aftercare: Secondary | ICD-10-CM | POA: Insufficient documentation

## 2011-10-01 DIAGNOSIS — Z9861 Coronary angioplasty status: Secondary | ICD-10-CM | POA: Insufficient documentation

## 2011-10-01 DIAGNOSIS — I2510 Atherosclerotic heart disease of native coronary artery without angina pectoris: Secondary | ICD-10-CM | POA: Insufficient documentation

## 2011-10-01 DIAGNOSIS — E039 Hypothyroidism, unspecified: Secondary | ICD-10-CM | POA: Insufficient documentation

## 2011-10-01 DIAGNOSIS — I10 Essential (primary) hypertension: Secondary | ICD-10-CM | POA: Insufficient documentation

## 2011-10-01 DIAGNOSIS — I447 Left bundle-branch block, unspecified: Secondary | ICD-10-CM | POA: Insufficient documentation

## 2011-10-02 ENCOUNTER — Encounter (HOSPITAL_COMMUNITY): Payer: Medicare Other

## 2011-10-02 ENCOUNTER — Encounter (HOSPITAL_COMMUNITY)
Admission: RE | Admit: 2011-10-02 | Payer: Medicare Other | Source: Ambulatory Visit | Attending: Internal Medicine | Admitting: Internal Medicine

## 2011-10-03 ENCOUNTER — Encounter (HOSPITAL_COMMUNITY): Payer: Medicare Other

## 2011-10-04 ENCOUNTER — Encounter (HOSPITAL_COMMUNITY): Payer: Medicare Other

## 2011-10-08 ENCOUNTER — Encounter (HOSPITAL_COMMUNITY)
Admission: RE | Admit: 2011-10-08 | Discharge: 2011-10-08 | Disposition: A | Discharge status: Home / Self Care | Payer: Medicare Other | Source: Ambulatory Visit | Attending: Internal Medicine | Admitting: Internal Medicine

## 2011-10-09 ENCOUNTER — Encounter (HOSPITAL_COMMUNITY)
Admission: RE | Admit: 2011-10-09 | Discharge: 2011-10-09 | Disposition: A | Discharge status: Home / Self Care | Payer: Medicare Other | Source: Ambulatory Visit | Attending: Internal Medicine | Admitting: Internal Medicine

## 2011-10-09 ENCOUNTER — Encounter (HOSPITAL_COMMUNITY): Payer: Medicare Other

## 2011-10-10 ENCOUNTER — Encounter (HOSPITAL_COMMUNITY): Payer: Medicare Other

## 2011-10-11 ENCOUNTER — Encounter (HOSPITAL_COMMUNITY): Payer: Medicare Other

## 2011-10-11 ENCOUNTER — Encounter (HOSPITAL_COMMUNITY)
Admission: RE | Admit: 2011-10-11 | Discharge: 2011-10-11 | Disposition: A | Discharge status: Home / Self Care | Payer: Medicare Other | Source: Ambulatory Visit | Attending: Internal Medicine | Admitting: Internal Medicine

## 2011-10-15 ENCOUNTER — Encounter (HOSPITAL_COMMUNITY): Payer: Medicare Other

## 2011-10-16 ENCOUNTER — Encounter (HOSPITAL_COMMUNITY): Payer: Medicare Other

## 2011-10-17 ENCOUNTER — Encounter (HOSPITAL_COMMUNITY): Payer: Medicare Other

## 2011-10-18 ENCOUNTER — Encounter (HOSPITAL_COMMUNITY): Payer: Medicare Other

## 2011-10-22 ENCOUNTER — Encounter (HOSPITAL_COMMUNITY)
Admission: RE | Admit: 2011-10-22 | Discharge: 2011-10-22 | Disposition: A | Discharge status: Home / Self Care | Payer: Medicare Other | Source: Ambulatory Visit | Attending: Internal Medicine | Admitting: Internal Medicine

## 2011-10-23 ENCOUNTER — Encounter (HOSPITAL_COMMUNITY): Payer: Medicare Other

## 2011-10-23 ENCOUNTER — Encounter (HOSPITAL_COMMUNITY)
Admission: RE | Admit: 2011-10-23 | Discharge: 2011-10-23 | Disposition: A | Discharge status: Home / Self Care | Payer: Medicare Other | Source: Ambulatory Visit | Attending: Internal Medicine | Admitting: Internal Medicine

## 2011-10-24 ENCOUNTER — Encounter (HOSPITAL_COMMUNITY): Payer: Medicare Other

## 2011-10-25 ENCOUNTER — Encounter (HOSPITAL_COMMUNITY)
Admission: RE | Admit: 2011-10-25 | Discharge: 2011-10-25 | Disposition: A | Discharge status: Home / Self Care | Payer: Medicare Other | Source: Ambulatory Visit | Attending: Internal Medicine | Admitting: Internal Medicine

## 2011-10-25 ENCOUNTER — Encounter (HOSPITAL_COMMUNITY): Payer: Medicare Other

## 2011-10-29 ENCOUNTER — Encounter (HOSPITAL_COMMUNITY)
Admission: RE | Admit: 2011-10-29 | Discharge: 2011-10-29 | Disposition: A | Discharge status: Home / Self Care | Payer: Medicare Other | Source: Ambulatory Visit | Attending: Internal Medicine | Admitting: Internal Medicine

## 2011-10-30 ENCOUNTER — Encounter (HOSPITAL_COMMUNITY): Payer: Medicare Other

## 2011-10-30 ENCOUNTER — Encounter (HOSPITAL_COMMUNITY)
Admission: RE | Admit: 2011-10-30 | Discharge: 2011-10-30 | Disposition: A | Discharge status: Home / Self Care | Payer: Medicare Other | Source: Ambulatory Visit | Attending: Internal Medicine | Admitting: Internal Medicine

## 2011-10-31 ENCOUNTER — Encounter (HOSPITAL_COMMUNITY): Payer: Medicare Other

## 2011-11-01 ENCOUNTER — Encounter (HOSPITAL_COMMUNITY): Payer: Medicare Other

## 2011-11-01 ENCOUNTER — Encounter (HOSPITAL_COMMUNITY)
Admission: RE | Admit: 2011-11-01 | Discharge: 2011-11-01 | Disposition: A | Discharge status: Home / Self Care | Payer: Medicare Other | Source: Ambulatory Visit | Attending: Internal Medicine | Admitting: Internal Medicine

## 2011-11-01 DIAGNOSIS — I447 Left bundle-branch block, unspecified: Secondary | ICD-10-CM | POA: Insufficient documentation

## 2011-11-01 DIAGNOSIS — Z9861 Coronary angioplasty status: Secondary | ICD-10-CM | POA: Insufficient documentation

## 2011-11-01 DIAGNOSIS — I252 Old myocardial infarction: Secondary | ICD-10-CM | POA: Insufficient documentation

## 2011-11-01 DIAGNOSIS — I10 Essential (primary) hypertension: Secondary | ICD-10-CM | POA: Insufficient documentation

## 2011-11-01 DIAGNOSIS — Z5189 Encounter for other specified aftercare: Secondary | ICD-10-CM | POA: Insufficient documentation

## 2011-11-01 DIAGNOSIS — E039 Hypothyroidism, unspecified: Secondary | ICD-10-CM | POA: Insufficient documentation

## 2011-11-01 DIAGNOSIS — I2510 Atherosclerotic heart disease of native coronary artery without angina pectoris: Secondary | ICD-10-CM | POA: Insufficient documentation

## 2011-11-05 ENCOUNTER — Encounter (HOSPITAL_COMMUNITY): Payer: Medicare Other

## 2011-11-06 ENCOUNTER — Encounter (HOSPITAL_COMMUNITY): Payer: Medicare Other

## 2011-11-07 ENCOUNTER — Encounter (HOSPITAL_COMMUNITY): Payer: Medicare Other

## 2011-11-08 ENCOUNTER — Encounter (HOSPITAL_COMMUNITY): Payer: Medicare Other

## 2011-11-12 ENCOUNTER — Encounter (HOSPITAL_COMMUNITY): Payer: Medicare Other

## 2011-11-13 ENCOUNTER — Encounter (HOSPITAL_COMMUNITY): Payer: Medicare Other

## 2011-11-14 ENCOUNTER — Encounter (HOSPITAL_COMMUNITY): Payer: Medicare Other

## 2011-11-15 ENCOUNTER — Encounter (HOSPITAL_COMMUNITY): Payer: Self-pay

## 2011-11-15 ENCOUNTER — Ambulatory Visit (HOSPITAL_COMMUNITY)
Admission: RE | Admit: 2011-11-15 | Discharge: 2011-11-15 | Disposition: A | Discharge status: Home / Self Care | Payer: Medicare Other | Source: Ambulatory Visit | Attending: Internal Medicine | Admitting: Internal Medicine

## 2011-11-15 ENCOUNTER — Encounter (HOSPITAL_COMMUNITY): Payer: Medicare Other

## 2011-11-15 VITALS — BP 128/86 | HR 56 | Ht 59.0 in | Wt 170.0 lb

## 2011-11-15 DIAGNOSIS — I10 Essential (primary) hypertension: Secondary | ICD-10-CM | POA: Insufficient documentation

## 2011-11-15 DIAGNOSIS — R011 Cardiac murmur, unspecified: Secondary | ICD-10-CM | POA: Insufficient documentation

## 2011-11-15 DIAGNOSIS — I2510 Atherosclerotic heart disease of native coronary artery without angina pectoris: Secondary | ICD-10-CM | POA: Insufficient documentation

## 2011-11-15 NOTE — Patient Instructions (Addendum)
We will contact you in 1 year to schedule your next appointment.  

## 2011-11-15 NOTE — Assessment & Plan Note (Addendum)
Doing very well without ischemic symptoms. Congratulated her on cardiac rehab attendance. Given the fact that she is now 17 months out from BMS can stop Plavix.

## 2011-11-15 NOTE — Assessment & Plan Note (Signed)
Has aortic sclerotic murmur. Will continue to follow. At some point may need an echo.

## 2011-11-15 NOTE — Assessment & Plan Note (Signed)
Blood pressure well controlled. Continue current regimen.  

## 2011-11-15 NOTE — Addendum Note (Signed)
Encounter addended by: Scarlette Calico, RN on: 11/15/2011  2:28 PM<BR>     Documentation filed: Patient Instructions Section

## 2011-11-15 NOTE — Progress Notes (Signed)
History of Present Illness:  PCP: Johnella Moloney Primary Cardiologist:  Dr. Glori Bickers  [name] is a 78 y.o. female with a h/o DM2 and HTN who was admitted to [facility] 3/30 to 07/04/10 with a NSTEMI.  Cardiac cath demonstrated mid LAD 80% stenosis which was treated with a BMS.  Initial complaints were abdominal pain.  CT demonstrated diverticulosis but no acute findings.  LDL was 96.  ALT was 17.  She was placed on high dose statin.    She returns for yearly follow up today.  She has been completing cardiac rehab in the maintenance program 3x week.  She feels well.  She denies chest pain.  No SOB/orthopnea/PND.  She is able to complete all activities that she she wants.  Says her fatigue has improved.  Lipids followed by PCP.     ROS: All pertinent positives and negatives as in HPI, otherwise negative.   Past Medical History  Diagnosis Date  . CAD (coronary artery disease)     a. s/p NSTEMI 06/30/10 tx with BMS to LAD;  b. cath 4/12: mLAD 80% (PCI), RCA 20%; EF 65-70%;  c. echo 1/11: EF 65%, mild MR  . DM2 (diabetes mellitus, type 2)   . HTN (hypertension)   . Hypothyroidism   . Dyslipidemia     Crestor started 4/12  . LBBB (left bundle branch block)   . Diverticulosis   . Myocardial infarction     Current Outpatient Prescriptions  Medication Sig Dispense Refill  . amLODipine (NORVASC) 5 MG tablet Take 5 mg by mouth daily.      Marland Kitchen aspirin 81 MG tablet Take 81 mg by mouth daily.        Marland Kitchen atorvastatin (LIPITOR) 40 MG tablet Take 40 mg by mouth daily.        . carvedilol (COREG) 3.125 MG tablet Take 1 tablet (3.125 mg total) by mouth 2 (two) times daily with a meal.  60 tablet  2  . chlorthalidone (HYGROTON) 25 MG tablet Take 1 tablet (25 mg total) by mouth as needed.      . Cholecalciferol (VITAMIN D3) 2000 UNITS TABS Take 2,000 Int'l Units by mouth daily.        . clopidogrel (PLAVIX) 75 MG tablet Take 1 tablet (75 mg total) by mouth daily.  30 tablet  11  . levothyroxine (SYNTHROID,  LEVOTHROID) 50 MCG tablet Take 50 mcg by mouth daily.        Marland Kitchen lisinopril (PRINIVIL,ZESTRIL) 40 MG tablet Take 40 mg by mouth daily.        Marland Kitchen METFORMIN HCL ER PO Take 500 mg by mouth 2 (two) times daily after a meal.        . potassium chloride (KLOR-CON) 10 MEQ CR tablet Take 10 mEq by mouth daily.       Marland Kitchen guaiFENesin (ROBITUSSIN) 100 MG/5ML liquid Take 200 mg by mouth 3 (three) times daily as needed.          No Known Allergies  Vital Signs: BP 128/86  Pulse 56  Ht 4\' 11"  (1.499 m)  Wt 170 lb (77.111 kg)  BMI 34.34 kg/m2  SpO2 98%  PHYSICAL EXAM: Well nourished, well developed, in no acute distress HEENT: normal Neck: no JVD Cardiac:  normal S1, S2; RRR; 2/6 SEM over aortic area S2 crisp (likely aortic sclerosis) Lungs:  clear to auscultation bilaterally, no wheezing, rhonchi or rales Abd: soft, nontender, no hepatomegaly Ext: no edema; no cyanosis or clubbing Skin: warm and  dry Neuro:  CNs 2-12 intact, no focal abnormalities noted    ASSESSMENT AND PLAN:

## 2011-11-19 ENCOUNTER — Encounter (HOSPITAL_COMMUNITY): Payer: Medicare Other

## 2011-11-20 ENCOUNTER — Encounter (HOSPITAL_COMMUNITY): Payer: Medicare Other

## 2011-11-21 ENCOUNTER — Encounter (HOSPITAL_COMMUNITY): Payer: Medicare Other

## 2011-11-22 ENCOUNTER — Encounter (HOSPITAL_COMMUNITY): Payer: Medicare Other

## 2011-11-26 ENCOUNTER — Encounter (HOSPITAL_COMMUNITY): Payer: Medicare Other

## 2011-11-27 ENCOUNTER — Encounter (HOSPITAL_COMMUNITY): Payer: Medicare Other

## 2011-11-28 ENCOUNTER — Encounter (HOSPITAL_COMMUNITY): Payer: Medicare Other

## 2011-11-29 ENCOUNTER — Encounter (HOSPITAL_COMMUNITY): Payer: Medicare Other

## 2011-12-03 ENCOUNTER — Encounter (HOSPITAL_COMMUNITY): Payer: Self-pay

## 2011-12-04 ENCOUNTER — Encounter (HOSPITAL_COMMUNITY): Payer: Self-pay

## 2011-12-04 ENCOUNTER — Encounter (HOSPITAL_COMMUNITY): Payer: Medicare Other | Attending: Internal Medicine

## 2011-12-04 DIAGNOSIS — I10 Essential (primary) hypertension: Secondary | ICD-10-CM | POA: Insufficient documentation

## 2011-12-04 DIAGNOSIS — I447 Left bundle-branch block, unspecified: Secondary | ICD-10-CM | POA: Insufficient documentation

## 2011-12-04 DIAGNOSIS — I2510 Atherosclerotic heart disease of native coronary artery without angina pectoris: Secondary | ICD-10-CM | POA: Insufficient documentation

## 2011-12-04 DIAGNOSIS — Z5189 Encounter for other specified aftercare: Secondary | ICD-10-CM | POA: Insufficient documentation

## 2011-12-04 DIAGNOSIS — I252 Old myocardial infarction: Secondary | ICD-10-CM | POA: Insufficient documentation

## 2011-12-04 DIAGNOSIS — Z9861 Coronary angioplasty status: Secondary | ICD-10-CM | POA: Insufficient documentation

## 2011-12-04 DIAGNOSIS — E039 Hypothyroidism, unspecified: Secondary | ICD-10-CM | POA: Insufficient documentation

## 2011-12-05 ENCOUNTER — Encounter (HOSPITAL_COMMUNITY): Payer: Self-pay

## 2011-12-06 ENCOUNTER — Encounter (HOSPITAL_COMMUNITY): Payer: Self-pay

## 2011-12-06 ENCOUNTER — Encounter (HOSPITAL_COMMUNITY): Payer: Medicare Other

## 2011-12-10 ENCOUNTER — Encounter (HOSPITAL_COMMUNITY): Payer: Medicare Other

## 2011-12-11 ENCOUNTER — Encounter (HOSPITAL_COMMUNITY): Payer: Medicare Other

## 2011-12-11 ENCOUNTER — Encounter (HOSPITAL_COMMUNITY): Payer: Self-pay

## 2011-12-12 ENCOUNTER — Encounter (HOSPITAL_COMMUNITY): Payer: Self-pay

## 2011-12-13 ENCOUNTER — Encounter (HOSPITAL_COMMUNITY): Payer: Medicare Other

## 2011-12-13 ENCOUNTER — Encounter (HOSPITAL_COMMUNITY): Payer: Self-pay

## 2011-12-17 ENCOUNTER — Encounter (HOSPITAL_COMMUNITY): Payer: Medicare Other

## 2011-12-18 ENCOUNTER — Encounter (HOSPITAL_COMMUNITY): Payer: Self-pay

## 2011-12-18 ENCOUNTER — Encounter (HOSPITAL_COMMUNITY): Payer: Medicare Other

## 2011-12-19 ENCOUNTER — Encounter (HOSPITAL_COMMUNITY): Payer: Self-pay

## 2011-12-20 ENCOUNTER — Encounter (HOSPITAL_COMMUNITY): Payer: Medicare Other

## 2011-12-20 ENCOUNTER — Encounter (HOSPITAL_COMMUNITY): Payer: Self-pay

## 2011-12-24 ENCOUNTER — Encounter (HOSPITAL_COMMUNITY): Payer: Medicare Other

## 2011-12-25 ENCOUNTER — Encounter (HOSPITAL_COMMUNITY): Payer: Medicare Other

## 2011-12-25 ENCOUNTER — Encounter (HOSPITAL_COMMUNITY): Payer: Self-pay

## 2011-12-26 ENCOUNTER — Encounter (HOSPITAL_COMMUNITY): Payer: Self-pay

## 2011-12-27 ENCOUNTER — Encounter (HOSPITAL_COMMUNITY): Payer: Self-pay

## 2011-12-27 ENCOUNTER — Encounter (HOSPITAL_COMMUNITY): Payer: Medicare Other

## 2011-12-31 ENCOUNTER — Encounter (HOSPITAL_COMMUNITY): Payer: Medicare Other

## 2012-01-01 ENCOUNTER — Encounter (HOSPITAL_COMMUNITY): Payer: Self-pay

## 2012-01-01 ENCOUNTER — Encounter (HOSPITAL_COMMUNITY)
Admission: RE | Admit: 2012-01-01 | Payer: Self-pay | Source: Ambulatory Visit | Attending: Internal Medicine | Admitting: Internal Medicine

## 2012-01-01 ENCOUNTER — Telehealth (HOSPITAL_COMMUNITY): Payer: Self-pay | Admitting: *Deleted

## 2012-01-02 ENCOUNTER — Encounter (HOSPITAL_COMMUNITY)
Admission: RE | Admit: 2012-01-02 | Discharge: 2012-01-02 | Disposition: A | Discharge status: Home / Self Care | Payer: Self-pay | Source: Ambulatory Visit | Attending: Internal Medicine | Admitting: Internal Medicine

## 2012-01-02 ENCOUNTER — Encounter (HOSPITAL_COMMUNITY): Payer: Self-pay

## 2012-01-02 DIAGNOSIS — I447 Left bundle-branch block, unspecified: Secondary | ICD-10-CM | POA: Insufficient documentation

## 2012-01-02 DIAGNOSIS — I252 Old myocardial infarction: Secondary | ICD-10-CM | POA: Insufficient documentation

## 2012-01-02 DIAGNOSIS — Z9861 Coronary angioplasty status: Secondary | ICD-10-CM | POA: Insufficient documentation

## 2012-01-02 DIAGNOSIS — I2510 Atherosclerotic heart disease of native coronary artery without angina pectoris: Secondary | ICD-10-CM | POA: Insufficient documentation

## 2012-01-02 DIAGNOSIS — I10 Essential (primary) hypertension: Secondary | ICD-10-CM | POA: Insufficient documentation

## 2012-01-02 DIAGNOSIS — Z5189 Encounter for other specified aftercare: Secondary | ICD-10-CM | POA: Insufficient documentation

## 2012-01-02 DIAGNOSIS — E039 Hypothyroidism, unspecified: Secondary | ICD-10-CM | POA: Insufficient documentation

## 2012-01-03 ENCOUNTER — Encounter (HOSPITAL_COMMUNITY)
Admission: RE | Admit: 2012-01-03 | Discharge: 2012-01-03 | Disposition: A | Discharge status: Home / Self Care | Payer: Self-pay | Source: Ambulatory Visit | Attending: Internal Medicine | Admitting: Internal Medicine

## 2012-01-03 ENCOUNTER — Encounter (HOSPITAL_COMMUNITY): Payer: Self-pay

## 2012-01-07 ENCOUNTER — Encounter (HOSPITAL_COMMUNITY)
Admission: RE | Admit: 2012-01-07 | Discharge: 2012-01-07 | Disposition: A | Discharge status: Home / Self Care | Payer: Self-pay | Source: Ambulatory Visit | Attending: Internal Medicine | Admitting: Internal Medicine

## 2012-01-08 ENCOUNTER — Encounter (HOSPITAL_COMMUNITY)
Admission: RE | Admit: 2012-01-08 | Discharge: 2012-01-08 | Disposition: A | Discharge status: Home / Self Care | Payer: Self-pay | Source: Ambulatory Visit | Attending: Internal Medicine | Admitting: Internal Medicine

## 2012-01-08 ENCOUNTER — Encounter (HOSPITAL_COMMUNITY): Payer: Self-pay

## 2012-01-09 ENCOUNTER — Encounter (HOSPITAL_COMMUNITY): Payer: Self-pay

## 2012-01-10 ENCOUNTER — Encounter (HOSPITAL_COMMUNITY): Payer: Self-pay

## 2012-01-10 ENCOUNTER — Encounter (HOSPITAL_COMMUNITY)
Admission: RE | Admit: 2012-01-10 | Discharge: 2012-01-10 | Disposition: A | Discharge status: Home / Self Care | Payer: Self-pay | Source: Ambulatory Visit | Attending: Internal Medicine | Admitting: Internal Medicine

## 2012-01-14 ENCOUNTER — Encounter (HOSPITAL_COMMUNITY): Payer: Self-pay

## 2012-01-15 ENCOUNTER — Encounter (HOSPITAL_COMMUNITY)
Admission: RE | Admit: 2012-01-15 | Discharge: 2012-01-15 | Disposition: A | Discharge status: Home / Self Care | Payer: Self-pay | Source: Ambulatory Visit | Attending: Internal Medicine | Admitting: Internal Medicine

## 2012-01-15 ENCOUNTER — Encounter (HOSPITAL_COMMUNITY): Payer: Self-pay

## 2012-01-16 ENCOUNTER — Encounter (HOSPITAL_COMMUNITY): Payer: Self-pay

## 2012-01-17 ENCOUNTER — Encounter (HOSPITAL_COMMUNITY): Payer: Self-pay

## 2012-01-17 ENCOUNTER — Encounter (HOSPITAL_COMMUNITY)
Admission: RE | Admit: 2012-01-17 | Discharge: 2012-01-17 | Disposition: A | Discharge status: Home / Self Care | Payer: Self-pay | Source: Ambulatory Visit | Attending: Internal Medicine | Admitting: Internal Medicine

## 2012-01-21 ENCOUNTER — Encounter (HOSPITAL_COMMUNITY): Payer: Self-pay

## 2012-01-22 ENCOUNTER — Encounter (HOSPITAL_COMMUNITY): Payer: Self-pay

## 2012-01-23 ENCOUNTER — Encounter (HOSPITAL_COMMUNITY): Payer: Self-pay

## 2012-01-24 ENCOUNTER — Encounter (HOSPITAL_COMMUNITY): Payer: Self-pay

## 2012-01-28 ENCOUNTER — Encounter (HOSPITAL_COMMUNITY)
Admission: RE | Admit: 2012-01-28 | Discharge: 2012-01-28 | Disposition: A | Discharge status: Home / Self Care | Payer: Self-pay | Source: Ambulatory Visit | Attending: Internal Medicine | Admitting: Internal Medicine

## 2012-01-29 ENCOUNTER — Encounter (HOSPITAL_COMMUNITY): Payer: Self-pay

## 2012-01-29 ENCOUNTER — Encounter (HOSPITAL_COMMUNITY)
Admission: RE | Admit: 2012-01-29 | Discharge: 2012-01-29 | Disposition: A | Discharge status: Home / Self Care | Payer: Self-pay | Source: Ambulatory Visit | Attending: Internal Medicine | Admitting: Internal Medicine

## 2012-01-30 ENCOUNTER — Encounter (HOSPITAL_COMMUNITY): Payer: Self-pay

## 2012-01-31 ENCOUNTER — Encounter (HOSPITAL_COMMUNITY)
Admission: RE | Admit: 2012-01-31 | Discharge: 2012-01-31 | Disposition: A | Discharge status: Home / Self Care | Payer: Self-pay | Source: Ambulatory Visit | Attending: Internal Medicine | Admitting: Internal Medicine

## 2012-01-31 ENCOUNTER — Encounter (HOSPITAL_COMMUNITY): Payer: Self-pay

## 2012-02-04 ENCOUNTER — Encounter (HOSPITAL_COMMUNITY)
Admission: RE | Admit: 2012-02-04 | Discharge: 2012-02-04 | Disposition: A | Discharge status: Home / Self Care | Payer: Self-pay | Source: Ambulatory Visit | Attending: Internal Medicine | Admitting: Internal Medicine

## 2012-02-04 ENCOUNTER — Other Ambulatory Visit (HOSPITAL_COMMUNITY): Payer: Self-pay | Admitting: *Deleted

## 2012-02-04 DIAGNOSIS — I447 Left bundle-branch block, unspecified: Secondary | ICD-10-CM | POA: Insufficient documentation

## 2012-02-04 DIAGNOSIS — I2510 Atherosclerotic heart disease of native coronary artery without angina pectoris: Secondary | ICD-10-CM | POA: Insufficient documentation

## 2012-02-04 DIAGNOSIS — E039 Hypothyroidism, unspecified: Secondary | ICD-10-CM | POA: Insufficient documentation

## 2012-02-04 DIAGNOSIS — I252 Old myocardial infarction: Secondary | ICD-10-CM | POA: Insufficient documentation

## 2012-02-04 DIAGNOSIS — I10 Essential (primary) hypertension: Secondary | ICD-10-CM | POA: Insufficient documentation

## 2012-02-04 DIAGNOSIS — Z5189 Encounter for other specified aftercare: Secondary | ICD-10-CM | POA: Insufficient documentation

## 2012-02-04 DIAGNOSIS — Z9861 Coronary angioplasty status: Secondary | ICD-10-CM | POA: Insufficient documentation

## 2012-02-04 MED ORDER — CARVEDILOL 3.125 MG PO TABS: 3.1250 mg | ORAL_TABLET | Freq: Two times a day (BID) | ORAL | Status: DC

## 2012-02-05 ENCOUNTER — Encounter (HOSPITAL_COMMUNITY)
Admission: RE | Admit: 2012-02-05 | Discharge: 2012-02-05 | Disposition: A | Discharge status: Home / Self Care | Payer: Self-pay | Source: Ambulatory Visit | Attending: Internal Medicine | Admitting: Internal Medicine

## 2012-02-05 ENCOUNTER — Encounter (HOSPITAL_COMMUNITY): Payer: Self-pay

## 2012-02-06 ENCOUNTER — Encounter (HOSPITAL_COMMUNITY): Payer: Self-pay

## 2012-02-07 ENCOUNTER — Encounter (HOSPITAL_COMMUNITY)
Admission: RE | Admit: 2012-02-07 | Discharge: 2012-02-07 | Disposition: A | Discharge status: Home / Self Care | Payer: Self-pay | Source: Ambulatory Visit | Attending: Internal Medicine | Admitting: Internal Medicine

## 2012-02-07 ENCOUNTER — Encounter (HOSPITAL_COMMUNITY): Payer: Self-pay

## 2012-02-11 ENCOUNTER — Encounter (HOSPITAL_COMMUNITY): Payer: Self-pay

## 2012-02-12 ENCOUNTER — Encounter (HOSPITAL_COMMUNITY): Payer: Self-pay

## 2012-02-12 ENCOUNTER — Encounter (HOSPITAL_COMMUNITY)
Admission: RE | Admit: 2012-02-12 | Discharge: 2012-02-12 | Disposition: A | Discharge status: Home / Self Care | Payer: Self-pay | Source: Ambulatory Visit | Attending: Internal Medicine | Admitting: Internal Medicine

## 2012-02-13 ENCOUNTER — Encounter (HOSPITAL_COMMUNITY): Payer: Self-pay

## 2012-02-14 ENCOUNTER — Encounter (HOSPITAL_COMMUNITY): Payer: Self-pay

## 2012-02-18 ENCOUNTER — Encounter (HOSPITAL_COMMUNITY)
Admission: RE | Admit: 2012-02-18 | Discharge: 2012-02-18 | Disposition: A | Discharge status: Home / Self Care | Payer: Self-pay | Source: Ambulatory Visit | Attending: Internal Medicine | Admitting: Internal Medicine

## 2012-02-19 ENCOUNTER — Encounter (HOSPITAL_COMMUNITY): Payer: Self-pay

## 2012-02-19 ENCOUNTER — Encounter (HOSPITAL_COMMUNITY)
Admission: RE | Admit: 2012-02-19 | Discharge: 2012-02-19 | Disposition: A | Discharge status: Home / Self Care | Payer: Self-pay | Source: Ambulatory Visit | Attending: Internal Medicine | Admitting: Internal Medicine

## 2012-02-20 ENCOUNTER — Encounter (HOSPITAL_COMMUNITY): Payer: Self-pay

## 2012-02-21 ENCOUNTER — Encounter (HOSPITAL_COMMUNITY): Payer: Self-pay

## 2012-02-21 ENCOUNTER — Encounter (HOSPITAL_COMMUNITY)
Admission: RE | Admit: 2012-02-21 | Discharge: 2012-02-21 | Disposition: A | Discharge status: Home / Self Care | Payer: Self-pay | Source: Ambulatory Visit | Attending: Internal Medicine | Admitting: Internal Medicine

## 2012-02-25 ENCOUNTER — Encounter (HOSPITAL_COMMUNITY)
Admission: RE | Admit: 2012-02-25 | Discharge: 2012-02-25 | Disposition: A | Discharge status: Home / Self Care | Payer: Self-pay | Source: Ambulatory Visit | Attending: Internal Medicine | Admitting: Internal Medicine

## 2012-02-26 ENCOUNTER — Encounter (HOSPITAL_COMMUNITY): Payer: Self-pay

## 2012-02-26 ENCOUNTER — Encounter (HOSPITAL_COMMUNITY)
Admission: RE | Admit: 2012-02-26 | Discharge: 2012-02-26 | Disposition: A | Discharge status: Home / Self Care | Payer: Self-pay | Source: Ambulatory Visit | Attending: Internal Medicine | Admitting: Internal Medicine

## 2012-02-27 ENCOUNTER — Encounter (HOSPITAL_COMMUNITY): Payer: Self-pay

## 2012-02-28 ENCOUNTER — Encounter (HOSPITAL_COMMUNITY): Payer: Self-pay

## 2012-03-03 ENCOUNTER — Encounter (HOSPITAL_COMMUNITY): Payer: Self-pay

## 2012-03-03 DIAGNOSIS — E039 Hypothyroidism, unspecified: Secondary | ICD-10-CM | POA: Insufficient documentation

## 2012-03-03 DIAGNOSIS — I447 Left bundle-branch block, unspecified: Secondary | ICD-10-CM | POA: Insufficient documentation

## 2012-03-03 DIAGNOSIS — I10 Essential (primary) hypertension: Secondary | ICD-10-CM | POA: Insufficient documentation

## 2012-03-03 DIAGNOSIS — I252 Old myocardial infarction: Secondary | ICD-10-CM | POA: Insufficient documentation

## 2012-03-03 DIAGNOSIS — Z9861 Coronary angioplasty status: Secondary | ICD-10-CM | POA: Insufficient documentation

## 2012-03-03 DIAGNOSIS — I2510 Atherosclerotic heart disease of native coronary artery without angina pectoris: Secondary | ICD-10-CM | POA: Insufficient documentation

## 2012-03-03 DIAGNOSIS — Z5189 Encounter for other specified aftercare: Secondary | ICD-10-CM | POA: Insufficient documentation

## 2012-03-04 ENCOUNTER — Encounter (HOSPITAL_COMMUNITY)
Admission: RE | Admit: 2012-03-04 | Discharge: 2012-03-04 | Disposition: A | Discharge status: Home / Self Care | Payer: Self-pay | Source: Ambulatory Visit | Attending: Internal Medicine | Admitting: Internal Medicine

## 2012-03-04 ENCOUNTER — Encounter (HOSPITAL_COMMUNITY): Payer: Self-pay

## 2012-03-05 ENCOUNTER — Encounter (HOSPITAL_COMMUNITY): Payer: Self-pay

## 2012-03-06 ENCOUNTER — Encounter (HOSPITAL_COMMUNITY): Payer: Self-pay

## 2012-03-06 ENCOUNTER — Encounter (HOSPITAL_COMMUNITY)
Admission: RE | Admit: 2012-03-06 | Discharge: 2012-03-06 | Disposition: A | Discharge status: Home / Self Care | Payer: Self-pay | Source: Ambulatory Visit | Attending: Internal Medicine | Admitting: Internal Medicine

## 2012-03-10 ENCOUNTER — Encounter (HOSPITAL_COMMUNITY)
Admission: RE | Admit: 2012-03-10 | Discharge: 2012-03-10 | Disposition: A | Discharge status: Home / Self Care | Payer: Self-pay | Source: Ambulatory Visit | Attending: Internal Medicine | Admitting: Internal Medicine

## 2012-03-11 ENCOUNTER — Encounter (HOSPITAL_COMMUNITY): Payer: Self-pay

## 2012-03-12 ENCOUNTER — Encounter (HOSPITAL_COMMUNITY): Payer: Self-pay

## 2012-03-13 ENCOUNTER — Encounter (HOSPITAL_COMMUNITY)
Admission: RE | Admit: 2012-03-13 | Discharge: 2012-03-13 | Disposition: A | Discharge status: Home / Self Care | Payer: Self-pay | Source: Ambulatory Visit | Attending: Internal Medicine | Admitting: Internal Medicine

## 2012-03-13 ENCOUNTER — Encounter (HOSPITAL_COMMUNITY): Payer: Self-pay

## 2012-03-17 ENCOUNTER — Encounter (HOSPITAL_COMMUNITY): Payer: Self-pay

## 2012-03-18 ENCOUNTER — Encounter (HOSPITAL_COMMUNITY): Payer: Self-pay

## 2012-03-19 ENCOUNTER — Encounter (HOSPITAL_COMMUNITY): Payer: Self-pay

## 2012-03-20 ENCOUNTER — Encounter (HOSPITAL_COMMUNITY): Payer: Self-pay

## 2012-03-24 ENCOUNTER — Encounter (HOSPITAL_COMMUNITY): Payer: Self-pay

## 2012-03-25 ENCOUNTER — Encounter (HOSPITAL_COMMUNITY): Payer: Self-pay

## 2012-03-26 ENCOUNTER — Encounter (HOSPITAL_COMMUNITY): Payer: Self-pay

## 2012-03-27 ENCOUNTER — Encounter (HOSPITAL_COMMUNITY): Payer: Self-pay

## 2012-03-31 ENCOUNTER — Encounter (HOSPITAL_COMMUNITY)
Admission: RE | Admit: 2012-03-31 | Discharge: 2012-03-31 | Disposition: A | Discharge status: Home / Self Care | Payer: Self-pay | Source: Ambulatory Visit | Attending: Internal Medicine | Admitting: Internal Medicine

## 2012-04-01 ENCOUNTER — Encounter (HOSPITAL_COMMUNITY)
Admission: RE | Admit: 2012-04-01 | Discharge: 2012-04-01 | Disposition: A | Discharge status: Home / Self Care | Payer: Self-pay | Source: Ambulatory Visit | Attending: Internal Medicine | Admitting: Internal Medicine

## 2012-04-01 ENCOUNTER — Encounter (HOSPITAL_COMMUNITY): Payer: Self-pay

## 2012-04-03 ENCOUNTER — Encounter (HOSPITAL_COMMUNITY)
Admission: RE | Admit: 2012-04-03 | Discharge: 2012-04-03 | Disposition: A | Discharge status: Home / Self Care | Payer: Self-pay | Source: Ambulatory Visit | Attending: Internal Medicine | Admitting: Internal Medicine

## 2012-04-03 DIAGNOSIS — Z9861 Coronary angioplasty status: Secondary | ICD-10-CM | POA: Insufficient documentation

## 2012-04-03 DIAGNOSIS — I2510 Atherosclerotic heart disease of native coronary artery without angina pectoris: Secondary | ICD-10-CM | POA: Insufficient documentation

## 2012-04-03 DIAGNOSIS — I10 Essential (primary) hypertension: Secondary | ICD-10-CM | POA: Insufficient documentation

## 2012-04-03 DIAGNOSIS — I447 Left bundle-branch block, unspecified: Secondary | ICD-10-CM | POA: Insufficient documentation

## 2012-04-03 DIAGNOSIS — E039 Hypothyroidism, unspecified: Secondary | ICD-10-CM | POA: Insufficient documentation

## 2012-04-03 DIAGNOSIS — Z5189 Encounter for other specified aftercare: Secondary | ICD-10-CM | POA: Insufficient documentation

## 2012-04-03 DIAGNOSIS — I252 Old myocardial infarction: Secondary | ICD-10-CM | POA: Insufficient documentation

## 2012-04-14 ENCOUNTER — Encounter: Payer: Self-pay | Admitting: Internal Medicine

## 2012-04-14 ENCOUNTER — Encounter: Payer: Self-pay | Admitting: Cardiology

## 2012-04-22 ENCOUNTER — Encounter (HOSPITAL_COMMUNITY): Payer: Self-pay | Attending: Internal Medicine

## 2012-04-24 ENCOUNTER — Encounter (HOSPITAL_COMMUNITY): Payer: Self-pay

## 2012-04-28 ENCOUNTER — Encounter (HOSPITAL_COMMUNITY): Payer: Self-pay

## 2012-04-29 ENCOUNTER — Encounter (HOSPITAL_COMMUNITY): Payer: Self-pay

## 2012-05-01 ENCOUNTER — Encounter (HOSPITAL_COMMUNITY): Payer: Self-pay

## 2012-05-05 ENCOUNTER — Encounter (HOSPITAL_COMMUNITY): Payer: TRICARE For Life (TFL) | Attending: Internal Medicine

## 2012-05-05 DIAGNOSIS — I447 Left bundle-branch block, unspecified: Secondary | ICD-10-CM | POA: Insufficient documentation

## 2012-05-05 DIAGNOSIS — Z5189 Encounter for other specified aftercare: Secondary | ICD-10-CM | POA: Insufficient documentation

## 2012-05-05 DIAGNOSIS — Z9861 Coronary angioplasty status: Secondary | ICD-10-CM | POA: Insufficient documentation

## 2012-05-05 DIAGNOSIS — I252 Old myocardial infarction: Secondary | ICD-10-CM | POA: Insufficient documentation

## 2012-05-05 DIAGNOSIS — E039 Hypothyroidism, unspecified: Secondary | ICD-10-CM | POA: Insufficient documentation

## 2012-05-05 DIAGNOSIS — I2510 Atherosclerotic heart disease of native coronary artery without angina pectoris: Secondary | ICD-10-CM | POA: Insufficient documentation

## 2012-05-05 DIAGNOSIS — I10 Essential (primary) hypertension: Secondary | ICD-10-CM | POA: Insufficient documentation

## 2012-05-06 ENCOUNTER — Encounter (HOSPITAL_COMMUNITY): Payer: TRICARE For Life (TFL)

## 2012-05-08 ENCOUNTER — Encounter (HOSPITAL_COMMUNITY): Payer: TRICARE For Life (TFL)

## 2012-05-12 ENCOUNTER — Encounter (HOSPITAL_COMMUNITY): Payer: TRICARE For Life (TFL)

## 2012-05-13 ENCOUNTER — Encounter (HOSPITAL_COMMUNITY): Payer: TRICARE For Life (TFL)

## 2012-05-15 ENCOUNTER — Encounter (HOSPITAL_COMMUNITY): Payer: TRICARE For Life (TFL)

## 2012-05-19 ENCOUNTER — Encounter (HOSPITAL_COMMUNITY): Payer: TRICARE For Life (TFL)

## 2012-05-20 ENCOUNTER — Encounter (HOSPITAL_COMMUNITY): Payer: TRICARE For Life (TFL)

## 2012-05-22 ENCOUNTER — Encounter (HOSPITAL_COMMUNITY): Payer: TRICARE For Life (TFL)

## 2012-05-26 ENCOUNTER — Encounter (HOSPITAL_COMMUNITY): Payer: TRICARE For Life (TFL)

## 2012-05-27 ENCOUNTER — Encounter (HOSPITAL_COMMUNITY): Payer: TRICARE For Life (TFL)

## 2012-05-29 ENCOUNTER — Encounter (HOSPITAL_COMMUNITY): Payer: TRICARE For Life (TFL)

## 2012-05-30 ENCOUNTER — Telehealth (HOSPITAL_COMMUNITY): Payer: Self-pay | Admitting: *Deleted

## 2012-06-02 ENCOUNTER — Encounter (HOSPITAL_COMMUNITY): Payer: TRICARE For Life (TFL) | Attending: Internal Medicine

## 2012-06-02 DIAGNOSIS — I2510 Atherosclerotic heart disease of native coronary artery without angina pectoris: Secondary | ICD-10-CM | POA: Insufficient documentation

## 2012-06-02 DIAGNOSIS — I447 Left bundle-branch block, unspecified: Secondary | ICD-10-CM | POA: Insufficient documentation

## 2012-06-02 DIAGNOSIS — E039 Hypothyroidism, unspecified: Secondary | ICD-10-CM | POA: Insufficient documentation

## 2012-06-02 DIAGNOSIS — Z5189 Encounter for other specified aftercare: Secondary | ICD-10-CM | POA: Insufficient documentation

## 2012-06-02 DIAGNOSIS — I10 Essential (primary) hypertension: Secondary | ICD-10-CM | POA: Insufficient documentation

## 2012-06-02 DIAGNOSIS — Z9861 Coronary angioplasty status: Secondary | ICD-10-CM | POA: Insufficient documentation

## 2012-06-02 DIAGNOSIS — I252 Old myocardial infarction: Secondary | ICD-10-CM | POA: Insufficient documentation

## 2012-06-03 ENCOUNTER — Encounter (HOSPITAL_COMMUNITY): Payer: TRICARE For Life (TFL)

## 2012-06-05 ENCOUNTER — Encounter (HOSPITAL_COMMUNITY): Payer: TRICARE For Life (TFL)

## 2012-06-05 ENCOUNTER — Encounter: Payer: Self-pay | Admitting: Internal Medicine

## 2012-06-09 ENCOUNTER — Encounter (HOSPITAL_COMMUNITY): Payer: TRICARE For Life (TFL)

## 2012-06-10 ENCOUNTER — Encounter (HOSPITAL_COMMUNITY): Payer: TRICARE For Life (TFL)

## 2012-06-12 ENCOUNTER — Encounter (HOSPITAL_COMMUNITY): Payer: TRICARE For Life (TFL)

## 2012-06-16 ENCOUNTER — Encounter (HOSPITAL_COMMUNITY): Payer: TRICARE For Life (TFL)

## 2012-06-17 ENCOUNTER — Encounter (HOSPITAL_COMMUNITY): Payer: TRICARE For Life (TFL)

## 2012-06-19 ENCOUNTER — Encounter (HOSPITAL_COMMUNITY): Payer: TRICARE For Life (TFL)

## 2012-06-23 ENCOUNTER — Encounter (HOSPITAL_COMMUNITY): Admission: RE | Admit: 2012-06-23 | Payer: TRICARE For Life (TFL) | Source: Ambulatory Visit

## 2012-06-23 ENCOUNTER — Telehealth (HOSPITAL_COMMUNITY): Payer: Self-pay | Admitting: *Deleted

## 2012-06-24 ENCOUNTER — Encounter (HOSPITAL_COMMUNITY): Payer: TRICARE For Life (TFL)

## 2012-06-26 ENCOUNTER — Encounter (HOSPITAL_COMMUNITY): Payer: TRICARE For Life (TFL)

## 2012-06-30 ENCOUNTER — Encounter (HOSPITAL_COMMUNITY): Payer: TRICARE For Life (TFL)

## 2012-07-01 ENCOUNTER — Encounter (HOSPITAL_COMMUNITY): Payer: TRICARE For Life (TFL) | Attending: Internal Medicine

## 2012-07-01 DIAGNOSIS — Z5189 Encounter for other specified aftercare: Secondary | ICD-10-CM | POA: Insufficient documentation

## 2012-07-01 DIAGNOSIS — I10 Essential (primary) hypertension: Secondary | ICD-10-CM | POA: Insufficient documentation

## 2012-07-01 DIAGNOSIS — I447 Left bundle-branch block, unspecified: Secondary | ICD-10-CM | POA: Insufficient documentation

## 2012-07-01 DIAGNOSIS — I2510 Atherosclerotic heart disease of native coronary artery without angina pectoris: Secondary | ICD-10-CM | POA: Insufficient documentation

## 2012-07-01 DIAGNOSIS — I252 Old myocardial infarction: Secondary | ICD-10-CM | POA: Insufficient documentation

## 2012-07-01 DIAGNOSIS — E039 Hypothyroidism, unspecified: Secondary | ICD-10-CM | POA: Insufficient documentation

## 2012-07-01 DIAGNOSIS — Z9861 Coronary angioplasty status: Secondary | ICD-10-CM | POA: Insufficient documentation

## 2012-07-03 ENCOUNTER — Encounter (HOSPITAL_COMMUNITY): Payer: TRICARE For Life (TFL)

## 2012-07-07 ENCOUNTER — Encounter (HOSPITAL_COMMUNITY): Payer: TRICARE For Life (TFL)

## 2012-07-08 ENCOUNTER — Encounter (HOSPITAL_COMMUNITY): Payer: TRICARE For Life (TFL)

## 2012-07-10 ENCOUNTER — Encounter (HOSPITAL_COMMUNITY): Payer: TRICARE For Life (TFL)

## 2012-07-14 ENCOUNTER — Encounter (HOSPITAL_COMMUNITY): Payer: TRICARE For Life (TFL)

## 2012-07-15 ENCOUNTER — Encounter (HOSPITAL_COMMUNITY): Payer: TRICARE For Life (TFL)

## 2012-07-17 ENCOUNTER — Encounter (HOSPITAL_COMMUNITY): Payer: TRICARE For Life (TFL)

## 2012-07-21 ENCOUNTER — Encounter (HOSPITAL_COMMUNITY): Payer: TRICARE For Life (TFL)

## 2012-07-22 ENCOUNTER — Encounter (HOSPITAL_COMMUNITY): Payer: TRICARE For Life (TFL)

## 2012-07-24 ENCOUNTER — Encounter (HOSPITAL_COMMUNITY): Payer: TRICARE For Life (TFL)

## 2012-07-28 ENCOUNTER — Encounter (HOSPITAL_COMMUNITY): Payer: TRICARE For Life (TFL)

## 2012-07-29 ENCOUNTER — Encounter (HOSPITAL_COMMUNITY): Payer: TRICARE For Life (TFL)

## 2012-07-31 ENCOUNTER — Encounter (HOSPITAL_COMMUNITY): Payer: TRICARE For Life (TFL)

## 2012-08-04 ENCOUNTER — Encounter (HOSPITAL_COMMUNITY): Payer: TRICARE For Life (TFL)

## 2012-08-05 ENCOUNTER — Encounter (HOSPITAL_COMMUNITY): Payer: TRICARE For Life (TFL)

## 2012-08-07 ENCOUNTER — Encounter (HOSPITAL_COMMUNITY): Payer: TRICARE For Life (TFL)

## 2012-08-11 ENCOUNTER — Encounter (HOSPITAL_COMMUNITY): Payer: TRICARE For Life (TFL)

## 2012-08-12 ENCOUNTER — Encounter (HOSPITAL_COMMUNITY): Payer: TRICARE For Life (TFL)

## 2012-08-14 ENCOUNTER — Encounter (HOSPITAL_COMMUNITY): Payer: TRICARE For Life (TFL)

## 2012-08-18 ENCOUNTER — Encounter (HOSPITAL_COMMUNITY): Payer: TRICARE For Life (TFL)

## 2012-08-19 ENCOUNTER — Encounter (HOSPITAL_COMMUNITY): Payer: TRICARE For Life (TFL)

## 2012-08-21 ENCOUNTER — Encounter (HOSPITAL_COMMUNITY): Payer: TRICARE For Life (TFL)

## 2012-08-26 ENCOUNTER — Encounter (HOSPITAL_COMMUNITY): Payer: TRICARE For Life (TFL)

## 2012-08-28 ENCOUNTER — Encounter (HOSPITAL_COMMUNITY): Payer: TRICARE For Life (TFL)

## 2012-09-01 ENCOUNTER — Encounter (HOSPITAL_COMMUNITY): Payer: TRICARE For Life (TFL)

## 2012-09-02 ENCOUNTER — Encounter (HOSPITAL_COMMUNITY): Payer: TRICARE For Life (TFL)

## 2012-09-04 ENCOUNTER — Encounter (HOSPITAL_COMMUNITY): Payer: TRICARE For Life (TFL)

## 2012-09-08 ENCOUNTER — Other Ambulatory Visit (HOSPITAL_COMMUNITY): Payer: Self-pay | Admitting: *Deleted

## 2012-09-08 ENCOUNTER — Encounter (HOSPITAL_COMMUNITY): Payer: TRICARE For Life (TFL)

## 2012-09-08 MED ORDER — CARVEDILOL 3.125 MG PO TABS: 3.1250 mg | ORAL_TABLET | Freq: Two times a day (BID) | ORAL | Status: DC

## 2012-09-09 ENCOUNTER — Encounter (HOSPITAL_COMMUNITY): Payer: TRICARE For Life (TFL)

## 2012-09-11 ENCOUNTER — Encounter (HOSPITAL_COMMUNITY): Payer: TRICARE For Life (TFL)

## 2012-09-15 ENCOUNTER — Encounter (HOSPITAL_COMMUNITY): Payer: TRICARE For Life (TFL)

## 2012-09-16 ENCOUNTER — Encounter (HOSPITAL_COMMUNITY): Payer: TRICARE For Life (TFL)

## 2012-09-18 ENCOUNTER — Encounter (HOSPITAL_COMMUNITY): Payer: TRICARE For Life (TFL)

## 2012-09-22 ENCOUNTER — Encounter (HOSPITAL_COMMUNITY): Payer: TRICARE For Life (TFL)

## 2012-09-23 ENCOUNTER — Encounter (HOSPITAL_COMMUNITY): Payer: TRICARE For Life (TFL)

## 2012-09-25 ENCOUNTER — Encounter (HOSPITAL_COMMUNITY): Payer: TRICARE For Life (TFL)

## 2012-09-29 ENCOUNTER — Encounter (HOSPITAL_COMMUNITY): Payer: TRICARE For Life (TFL)

## 2012-09-30 ENCOUNTER — Encounter (HOSPITAL_COMMUNITY): Payer: TRICARE For Life (TFL)

## 2012-10-02 ENCOUNTER — Encounter (HOSPITAL_COMMUNITY): Payer: TRICARE For Life (TFL)

## 2012-10-06 ENCOUNTER — Encounter (HOSPITAL_COMMUNITY): Payer: TRICARE For Life (TFL)

## 2012-10-07 ENCOUNTER — Encounter (HOSPITAL_COMMUNITY): Payer: TRICARE For Life (TFL)

## 2012-10-09 ENCOUNTER — Encounter (HOSPITAL_COMMUNITY): Payer: TRICARE For Life (TFL)

## 2012-10-13 ENCOUNTER — Encounter (HOSPITAL_COMMUNITY): Payer: TRICARE For Life (TFL)

## 2012-10-14 ENCOUNTER — Encounter (HOSPITAL_COMMUNITY): Payer: TRICARE For Life (TFL)

## 2012-10-16 ENCOUNTER — Encounter (HOSPITAL_COMMUNITY): Payer: Self-pay

## 2012-10-16 ENCOUNTER — Ambulatory Visit (HOSPITAL_COMMUNITY)
Admission: RE | Admit: 2012-10-16 | Discharge: 2012-10-16 | Disposition: A | Discharge status: Home / Self Care | Payer: Medicare Other | Source: Ambulatory Visit | Attending: Cardiology | Admitting: Cardiology

## 2012-10-16 ENCOUNTER — Encounter (HOSPITAL_COMMUNITY): Payer: TRICARE For Life (TFL)

## 2012-10-16 VITALS — BP 147/64 | HR 57 | Wt 173.8 lb

## 2012-10-16 DIAGNOSIS — E785 Hyperlipidemia, unspecified: Secondary | ICD-10-CM | POA: Insufficient documentation

## 2012-10-16 DIAGNOSIS — I10 Essential (primary) hypertension: Secondary | ICD-10-CM | POA: Insufficient documentation

## 2012-10-16 DIAGNOSIS — I2510 Atherosclerotic heart disease of native coronary artery without angina pectoris: Secondary | ICD-10-CM | POA: Insufficient documentation

## 2012-10-16 MED ORDER — AMLODIPINE BESYLATE 10 MG PO TABS: 10.0000 mg | ORAL_TABLET | Freq: Every day | ORAL | Status: DC

## 2012-10-16 NOTE — Patient Instructions (Addendum)
Increase your norvasc to 10 mg daily. Take 2 tablets daily until you get your new prescription and then take 1 10 mg tablet daily.  Increase your exercise and look into silver sneakers program at [facility].  Follow up 1 year.

## 2012-10-16 NOTE — Progress Notes (Signed)
Patient ID: [name], female   DOB: 1931-10-31, 79 y.o.   MRN: [id number] History of Present Illness:  PCP: [name] Primary Cardiologist:  Dr. Glori Meyer  [name] is a 79 y.o. female with a h/o DM2 and HTN who was admitted to [facility] 06/30/10 to 07/04/10 with a NSTEMI.  Cardiac cath demonstrated mid LAD 80% stenosis which was treated with a BMS.  Initial complaints were abdominal pain.  CT demonstrated diverticulosis but no acute findings.  LDL was 96.  ALT was 17.  She was placed on high dose statin.    She returns for yearly follow up today. Reports feeling great. Has not been exercising as much as last year, but would like to get back into some program. Denies SOB, CP, orthopnea. Taking medications as prescribed and weight stable. Has been traveling back and forth for vacations to [place] to visit family.   ROS: All pertinent positives and negatives as in HPI, otherwise negative.   Past Medical History  Diagnosis Date  . CAD (coronary artery disease)     a. s/p NSTEMI 06/30/10 tx with BMS to LAD;  b. cath 4/12: mLAD 80% (PCI), RCA 20%; EF 65-70%;  c. echo 1/11: EF 65%, mild MR  . DM2 (diabetes mellitus, type 2)   . HTN (hypertension)   . Hypothyroidism   . Dyslipidemia     Crestor started 4/12  . LBBB (left bundle branch block)   . Diverticulosis   . Myocardial infarction     Current Outpatient Prescriptions  Medication Sig Dispense Refill  . amLODipine (NORVASC) 5 MG tablet Take 5 mg by mouth daily.      Marland Kitchen aspirin 81 MG tablet Take 81 mg by mouth daily.        Marland Kitchen atorvastatin (LIPITOR) 40 MG tablet Take 40 mg by mouth daily.        . chlorthalidone (HYGROTON) 25 MG tablet Take 25 mg by mouth as needed. Take 1/2 tablet every morning.      . Cholecalciferol (VITAMIN D3) 2000 UNITS TABS Take 2,000 Int'l Units by mouth daily.        Marland Kitchen levothyroxine (SYNTHROID, LEVOTHROID) 50 MCG tablet Take 50 mcg by mouth daily.        Marland Kitchen losartan (COZAAR) 25 MG tablet Take 25 mg by mouth  daily.      . metoprolol succinate (TOPROL-XL) 25 MG 24 hr tablet Take 25 mg by mouth daily.      . sitaGLIPtin (JANUVIA) 50 MG tablet Take 50 mg by mouth daily.       No current facility-administered medications for this encounter.    No Known Allergies  Vital Signs: Filed Vitals:   10/16/12 0923  BP: 147/64  Pulse: 57  Weight: 173 lb 12.8 oz (78.835 kg)  SpO2: 97%    PHYSICAL EXAM: Well nourished, well developed, in no acute distress HEENT: normal Neck: no JVD Cardiac:  normal S1, S2; RRR; 2/6 SEM over aortic area S2 crisp (likely aortic sclerosis) Lungs:  clear to auscultation bilaterally, no wheezing, rhonchi or rales Abd: soft, nontender, no hepatomegaly Ext: no edema; no cyanosis or clubbing Skin: warm and dry Neuro:  CNs 2-12 intact, no focal abnormalities noted  ASSESSMENT AND PLAN:  1) CAD- continue BB, ASA, ARB and statin.  No ischemic symptoms. Have encouraged her to get back involved in an exercise program, suggested silver sneakers.  2) HTN-  Mildly elevated and has been in the [id number] systolic at home. Will increase norvasc  today to 10 mg daily.   3) Dyslipidemia - continue statin. Will get most recent lipids from PCP's office.   [name] 10/16/2012

## 2012-10-20 ENCOUNTER — Encounter (HOSPITAL_COMMUNITY): Payer: TRICARE For Life (TFL)

## 2012-10-21 ENCOUNTER — Encounter (HOSPITAL_COMMUNITY): Payer: TRICARE For Life (TFL)

## 2012-10-23 ENCOUNTER — Encounter (HOSPITAL_COMMUNITY): Payer: TRICARE For Life (TFL)

## 2012-10-27 ENCOUNTER — Encounter (HOSPITAL_COMMUNITY): Payer: TRICARE For Life (TFL)

## 2012-10-28 ENCOUNTER — Encounter (HOSPITAL_COMMUNITY): Payer: TRICARE For Life (TFL)

## 2012-10-30 ENCOUNTER — Encounter (HOSPITAL_COMMUNITY): Payer: TRICARE For Life (TFL)

## 2012-10-31 ENCOUNTER — Other Ambulatory Visit (HOSPITAL_COMMUNITY): Payer: Self-pay | Admitting: *Deleted

## 2012-11-03 ENCOUNTER — Encounter (HOSPITAL_COMMUNITY): Payer: TRICARE For Life (TFL)

## 2012-11-04 ENCOUNTER — Encounter (HOSPITAL_COMMUNITY): Payer: TRICARE For Life (TFL)

## 2012-11-06 ENCOUNTER — Encounter (HOSPITAL_COMMUNITY): Payer: TRICARE For Life (TFL)

## 2012-11-07 ENCOUNTER — Other Ambulatory Visit (HOSPITAL_COMMUNITY): Payer: Self-pay | Admitting: *Deleted

## 2012-11-07 MED ORDER — CARVEDILOL 3.125 MG PO TABS: 3.1250 mg | ORAL_TABLET | Freq: Two times a day (BID) | ORAL | Status: DC

## 2012-11-07 NOTE — Telephone Encounter (Addendum)
Received refill request for Carvedilol, however metoprolol is on pt's med list, spoke w/[facility] they state pt has been getting both Metoprolol and Carvedilol, pt states she has been on Metoprolol prescribed by Dr Sandi Mariscal but not sure why Carvedilol was stopped, Dr Haroldine Laws would prefer pt be on Carvedilol, pt will switch over after she finished the Metoprolol, pharmacy is aware

## 2012-11-10 ENCOUNTER — Encounter (HOSPITAL_COMMUNITY): Payer: TRICARE For Life (TFL)

## 2012-11-11 ENCOUNTER — Encounter (HOSPITAL_COMMUNITY): Payer: TRICARE For Life (TFL)

## 2012-11-13 ENCOUNTER — Encounter (HOSPITAL_COMMUNITY): Payer: TRICARE For Life (TFL)

## 2012-11-17 ENCOUNTER — Encounter (HOSPITAL_COMMUNITY): Payer: TRICARE For Life (TFL)

## 2012-11-18 ENCOUNTER — Encounter (HOSPITAL_COMMUNITY): Payer: TRICARE For Life (TFL)

## 2012-11-20 ENCOUNTER — Encounter (HOSPITAL_COMMUNITY): Payer: TRICARE For Life (TFL)

## 2012-11-24 ENCOUNTER — Encounter (HOSPITAL_COMMUNITY): Payer: TRICARE For Life (TFL)

## 2012-11-25 ENCOUNTER — Encounter (HOSPITAL_COMMUNITY): Payer: TRICARE For Life (TFL)

## 2012-11-27 ENCOUNTER — Encounter (HOSPITAL_COMMUNITY): Payer: TRICARE For Life (TFL)

## 2012-12-02 ENCOUNTER — Encounter (HOSPITAL_COMMUNITY): Payer: TRICARE For Life (TFL)

## 2012-12-04 ENCOUNTER — Encounter (HOSPITAL_COMMUNITY): Payer: TRICARE For Life (TFL)

## 2012-12-08 ENCOUNTER — Encounter (HOSPITAL_COMMUNITY): Payer: TRICARE For Life (TFL)

## 2012-12-09 ENCOUNTER — Encounter (HOSPITAL_COMMUNITY): Payer: TRICARE For Life (TFL)

## 2012-12-11 ENCOUNTER — Encounter (HOSPITAL_COMMUNITY): Payer: TRICARE For Life (TFL)

## 2012-12-15 ENCOUNTER — Encounter (HOSPITAL_COMMUNITY): Payer: TRICARE For Life (TFL)

## 2012-12-16 ENCOUNTER — Encounter (HOSPITAL_COMMUNITY): Payer: TRICARE For Life (TFL)

## 2012-12-18 ENCOUNTER — Encounter (HOSPITAL_COMMUNITY): Payer: TRICARE For Life (TFL)

## 2012-12-22 ENCOUNTER — Encounter (HOSPITAL_COMMUNITY): Payer: TRICARE For Life (TFL)

## 2012-12-23 ENCOUNTER — Encounter (HOSPITAL_COMMUNITY): Payer: TRICARE For Life (TFL)

## 2012-12-25 ENCOUNTER — Encounter (HOSPITAL_COMMUNITY): Payer: TRICARE For Life (TFL)

## 2012-12-29 ENCOUNTER — Encounter (HOSPITAL_COMMUNITY): Payer: TRICARE For Life (TFL)

## 2012-12-30 ENCOUNTER — Encounter (HOSPITAL_COMMUNITY): Payer: TRICARE For Life (TFL)

## 2013-01-01 ENCOUNTER — Encounter (HOSPITAL_COMMUNITY): Payer: TRICARE For Life (TFL)

## 2013-01-05 ENCOUNTER — Encounter (HOSPITAL_COMMUNITY): Payer: TRICARE For Life (TFL)

## 2013-01-06 ENCOUNTER — Encounter (HOSPITAL_COMMUNITY): Payer: TRICARE For Life (TFL)

## 2013-01-08 ENCOUNTER — Encounter (HOSPITAL_COMMUNITY): Payer: TRICARE For Life (TFL)

## 2013-01-12 ENCOUNTER — Encounter (HOSPITAL_COMMUNITY): Payer: TRICARE For Life (TFL)

## 2013-01-13 ENCOUNTER — Encounter (HOSPITAL_COMMUNITY): Payer: TRICARE For Life (TFL)

## 2013-01-15 ENCOUNTER — Encounter (HOSPITAL_COMMUNITY): Payer: TRICARE For Life (TFL)

## 2013-01-19 ENCOUNTER — Encounter (HOSPITAL_COMMUNITY): Payer: TRICARE For Life (TFL)

## 2013-01-20 ENCOUNTER — Encounter (HOSPITAL_COMMUNITY): Payer: TRICARE For Life (TFL)

## 2013-01-22 ENCOUNTER — Encounter (HOSPITAL_COMMUNITY): Payer: TRICARE For Life (TFL)

## 2013-01-26 ENCOUNTER — Encounter (HOSPITAL_COMMUNITY): Payer: TRICARE For Life (TFL)

## 2013-01-27 ENCOUNTER — Encounter (HOSPITAL_COMMUNITY): Payer: TRICARE For Life (TFL)

## 2013-01-29 ENCOUNTER — Encounter (HOSPITAL_COMMUNITY): Payer: TRICARE For Life (TFL)

## 2013-02-02 ENCOUNTER — Encounter (HOSPITAL_COMMUNITY): Payer: TRICARE For Life (TFL)

## 2013-02-03 ENCOUNTER — Encounter (HOSPITAL_COMMUNITY): Payer: TRICARE For Life (TFL)

## 2013-02-05 ENCOUNTER — Encounter (HOSPITAL_COMMUNITY): Payer: TRICARE For Life (TFL)

## 2013-02-09 ENCOUNTER — Encounter (HOSPITAL_COMMUNITY): Payer: TRICARE For Life (TFL)

## 2013-02-10 ENCOUNTER — Encounter (HOSPITAL_COMMUNITY): Payer: TRICARE For Life (TFL)

## 2013-02-12 ENCOUNTER — Encounter (HOSPITAL_COMMUNITY): Payer: TRICARE For Life (TFL)

## 2013-02-16 ENCOUNTER — Encounter (HOSPITAL_COMMUNITY): Payer: TRICARE For Life (TFL)

## 2013-02-17 ENCOUNTER — Encounter (HOSPITAL_COMMUNITY): Payer: TRICARE For Life (TFL)

## 2013-02-19 ENCOUNTER — Encounter (HOSPITAL_COMMUNITY): Payer: TRICARE For Life (TFL)

## 2013-02-23 ENCOUNTER — Encounter (HOSPITAL_COMMUNITY): Payer: TRICARE For Life (TFL)

## 2013-02-24 ENCOUNTER — Encounter (HOSPITAL_COMMUNITY): Payer: TRICARE For Life (TFL)

## 2013-03-02 ENCOUNTER — Encounter (HOSPITAL_COMMUNITY): Payer: TRICARE For Life (TFL)

## 2013-03-03 ENCOUNTER — Encounter (HOSPITAL_COMMUNITY): Payer: TRICARE For Life (TFL)

## 2013-03-05 ENCOUNTER — Encounter (HOSPITAL_COMMUNITY): Payer: TRICARE For Life (TFL)

## 2013-03-09 ENCOUNTER — Encounter (HOSPITAL_COMMUNITY): Payer: TRICARE For Life (TFL)

## 2013-03-10 ENCOUNTER — Encounter (HOSPITAL_COMMUNITY): Payer: TRICARE For Life (TFL)

## 2013-03-12 ENCOUNTER — Encounter (HOSPITAL_COMMUNITY): Payer: TRICARE For Life (TFL)

## 2013-03-16 ENCOUNTER — Encounter (HOSPITAL_COMMUNITY): Payer: TRICARE For Life (TFL)

## 2013-03-17 ENCOUNTER — Encounter (HOSPITAL_COMMUNITY): Payer: TRICARE For Life (TFL)

## 2013-03-19 ENCOUNTER — Encounter (HOSPITAL_COMMUNITY): Payer: TRICARE For Life (TFL)

## 2013-03-23 ENCOUNTER — Encounter (HOSPITAL_COMMUNITY): Payer: TRICARE For Life (TFL)

## 2013-03-24 ENCOUNTER — Encounter (HOSPITAL_COMMUNITY): Payer: TRICARE For Life (TFL)

## 2013-03-30 ENCOUNTER — Encounter (HOSPITAL_COMMUNITY): Payer: TRICARE For Life (TFL)

## 2013-03-31 ENCOUNTER — Encounter (HOSPITAL_COMMUNITY): Payer: TRICARE For Life (TFL)

## 2013-04-06 ENCOUNTER — Encounter (HOSPITAL_COMMUNITY): Payer: TRICARE For Life (TFL)

## 2013-04-07 ENCOUNTER — Encounter (HOSPITAL_COMMUNITY): Payer: TRICARE For Life (TFL)

## 2013-04-09 ENCOUNTER — Encounter (HOSPITAL_COMMUNITY): Payer: TRICARE For Life (TFL)

## 2013-04-13 ENCOUNTER — Encounter (HOSPITAL_COMMUNITY): Payer: TRICARE For Life (TFL)

## 2013-04-14 ENCOUNTER — Encounter (HOSPITAL_COMMUNITY): Payer: TRICARE For Life (TFL)

## 2013-04-16 ENCOUNTER — Encounter (HOSPITAL_COMMUNITY): Payer: TRICARE For Life (TFL)

## 2013-04-20 ENCOUNTER — Encounter (HOSPITAL_COMMUNITY): Payer: TRICARE For Life (TFL)

## 2013-04-21 ENCOUNTER — Encounter (HOSPITAL_COMMUNITY): Payer: TRICARE For Life (TFL)

## 2013-04-23 ENCOUNTER — Encounter (HOSPITAL_COMMUNITY): Payer: TRICARE For Life (TFL)

## 2013-04-27 ENCOUNTER — Encounter (HOSPITAL_COMMUNITY): Payer: TRICARE For Life (TFL)

## 2013-04-28 ENCOUNTER — Encounter (HOSPITAL_COMMUNITY): Payer: TRICARE For Life (TFL)

## 2013-04-30 ENCOUNTER — Encounter (HOSPITAL_COMMUNITY): Payer: TRICARE For Life (TFL)

## 2013-11-10 ENCOUNTER — Encounter (HOSPITAL_COMMUNITY): Payer: Self-pay | Admitting: Vascular Surgery

## 2013-12-22 ENCOUNTER — Encounter (HOSPITAL_COMMUNITY): Payer: Self-pay

## 2013-12-22 ENCOUNTER — Ambulatory Visit (HOSPITAL_COMMUNITY)
Admission: RE | Admit: 2013-12-22 | Discharge: 2013-12-22 | Disposition: A | Discharge status: Home / Self Care | Payer: Medicare Other | Source: Ambulatory Visit | Attending: Internal Medicine | Admitting: Internal Medicine

## 2013-12-22 VITALS — BP 121/61 | HR 48 | Resp 18 | Wt 170.5 lb

## 2013-12-22 DIAGNOSIS — E119 Type 2 diabetes mellitus without complications: Secondary | ICD-10-CM | POA: Insufficient documentation

## 2013-12-22 DIAGNOSIS — I498 Other specified cardiac arrhythmias: Secondary | ICD-10-CM | POA: Insufficient documentation

## 2013-12-22 DIAGNOSIS — E785 Hyperlipidemia, unspecified: Secondary | ICD-10-CM | POA: Diagnosis not present

## 2013-12-22 DIAGNOSIS — Z7982 Long term (current) use of aspirin: Secondary | ICD-10-CM | POA: Diagnosis not present

## 2013-12-22 DIAGNOSIS — I2510 Atherosclerotic heart disease of native coronary artery without angina pectoris: Secondary | ICD-10-CM | POA: Diagnosis not present

## 2013-12-22 DIAGNOSIS — R001 Bradycardia, unspecified: Secondary | ICD-10-CM

## 2013-12-22 DIAGNOSIS — I447 Left bundle-branch block, unspecified: Secondary | ICD-10-CM | POA: Diagnosis not present

## 2013-12-22 DIAGNOSIS — I10 Essential (primary) hypertension: Secondary | ICD-10-CM | POA: Diagnosis present

## 2013-12-22 LAB — CBC
HCT: 41.3 % (ref 36.0–46.0)
HEMOGLOBIN: 13.9 g/dL (ref 12.0–15.0)
MCH: 31.7 pg (ref 26.0–34.0)
MCHC: 33.7 g/dL (ref 30.0–36.0)
MCV: 94.1 fL (ref 78.0–100.0)
PLATELETS: 206 10*3/uL (ref 150–400)
RBC: 4.39 MIL/uL (ref 3.87–5.11)
RDW: 13.2 % (ref 11.5–15.5)
WBC: 5.2 10*3/uL (ref 4.0–10.5)

## 2013-12-22 LAB — BASIC METABOLIC PANEL
ANION GAP: 15 (ref 5–15)
BUN: 16 mg/dL (ref 6–23)
CHLORIDE: 101 meq/L (ref 96–112)
CO2: 23 mEq/L (ref 19–32)
Calcium: 9.5 mg/dL (ref 8.4–10.5)
Creatinine, Ser: 1.06 mg/dL (ref 0.50–1.10)
GFR, EST AFRICAN AMERICAN: 56 mL/min — AB (ref >90)
GFR, EST NON AFRICAN AMERICAN: 49 mL/min — AB (ref >90)
Glucose, Bld: 106 mg/dL — ABNORMAL HIGH (ref 70–99)
POTASSIUM: 3.6 meq/L — AB (ref 3.7–5.3)
SODIUM: 139 meq/L (ref 137–147)

## 2013-12-22 LAB — LIPID PANEL
CHOL/HDL RATIO: 2.1 ratio
CHOLESTEROL: 115 mg/dL (ref 0–200)
HDL: 55 mg/dL (ref >39)
LDL Cholesterol: 47 mg/dL (ref 0–99)
TRIGLYCERIDES: 66 mg/dL (ref <150)
VLDL: 13 mg/dL (ref 0–40)

## 2013-12-22 NOTE — Progress Notes (Signed)
Patient ID: [name], female   DOB: 1931/05/14, 80 y.o.   MRN: [id number] History of Present Illness: PCP: [name] Primary Cardiologist:  Dr. Glori Meyer  [name] is a 80 y.o. female with a h/o DM2 and HTN who was admitted to [facility] 06/30/10 to 07/04/10 with a NSTEMI.  Cardiac cath demonstrated mid LAD 80% stenosis which was treated with a BMS.  Initial complaints were abdominal pain.  CT demonstrated diverticulosis but no acute findings.  LDL was 96.  ALT was 17.  She was placed on high dose statin.    She returns for yearly follow up today. Reports feeling great. Denies SOB, CP, orthopnea.  Doing some walking for exercise.  Taking medications as prescribed and weight stable. No lightheadedness or syncope.  HR running low today in 40s.   ECG: sinus brady at 47, LBBB  ROS: All pertinent positives and negatives as in HPI, otherwise negative.   Past Medical History  Diagnosis Date  . CAD (coronary artery disease)     a. s/p NSTEMI 06/30/10 tx with BMS to LAD;  b. cath 4/12: mLAD 80% (PCI), RCA 20%; EF 65-70%;  c. echo 1/11: EF 65%, mild MR  . DM2 (diabetes mellitus, type 2)   . HTN (hypertension)   . Hypothyroidism   . Dyslipidemia     Crestor started 4/12  . LBBB (left bundle branch block)   . Diverticulosis   . Myocardial infarction     Current Outpatient Prescriptions  Medication Sig Dispense Refill  . amLODipine (NORVASC) 10 MG tablet Take 5 mg by mouth daily.      Marland Kitchen aspirin 81 MG tablet Take 81 mg by mouth daily.        Marland Kitchen atorvastatin (LIPITOR) 40 MG tablet Take 40 mg by mouth daily.        . chlorthalidone (HYGROTON) 25 MG tablet Take 25 mg by mouth as needed. Take 1/2 tablet every morning.      . Cholecalciferol (VITAMIN D3) 2000 UNITS TABS Take 2,000 Int'l Units by mouth daily.        Marland Kitchen levothyroxine (SYNTHROID, LEVOTHROID) 50 MCG tablet Take 50 mcg by mouth daily.        Marland Kitchen losartan (COZAAR) 25 MG tablet Take 50 mg by mouth daily.       . sitaGLIPtin (JANUVIA) 50 MG  tablet Take 50 mg by mouth daily.       No current facility-administered medications for this encounter.    No Known Allergies  Vital Signs: Filed Vitals:   12/22/13 0843  BP: 121/61  Pulse: 48  Resp: 18  Weight: 170 lb 8 oz (77.338 kg)  SpO2: 96%    PHYSICAL EXAM: Well nourished, well developed, in no acute distress HEENT: normal Neck: no JVD Cardiac:  normal S1, S2; RRR; 1/6 SEM.  Lungs:  clear to auscultation bilaterally, no wheezing, rhonchi or rales Abd: soft, nontender, no hepatomegaly Ext: no edema; no cyanosis or clubbing Skin: warm and dry Neuro:  CNs 2-12 intact, no focal abnormalities noted  ASSESSMENT AND PLAN:  1) CAD: Continue ASA 81 and statin.  No ischemic symptoms. Continue exercise.  2) HTN:  BP well-controlled.  3) Dyslipidemia: Will check LDL today.  4) Bradycardia: HR in 40s, no lightheadedness or syncope.  Stop Toprol XL.   [name] 12/22/2013

## 2013-12-22 NOTE — Patient Instructions (Signed)
Stop Metoprolol  Labs today  Your physician recommends that you schedule a follow-up appointment in: 1 year

## 2013-12-24 ENCOUNTER — Encounter (HOSPITAL_COMMUNITY): Payer: Self-pay | Admitting: *Deleted

## 2014-01-05 ENCOUNTER — Other Ambulatory Visit: Payer: Self-pay | Admitting: Family Medicine

## 2014-01-05 ENCOUNTER — Ambulatory Visit
Admission: RE | Admit: 2014-01-05 | Discharge: 2014-01-05 | Disposition: A | Discharge status: Home / Self Care | Payer: Medicare Other | Source: Ambulatory Visit | Attending: Family Medicine | Admitting: Family Medicine

## 2014-01-05 DIAGNOSIS — M545 Low back pain, unspecified: Secondary | ICD-10-CM

## 2015-09-12 ENCOUNTER — Encounter: Payer: Self-pay | Admitting: Internal Medicine

## 2016-07-12 IMAGING — CR DG LUMBAR SPINE COMPLETE 4+V
5 series · 5 of 5 positions shown · non-contrast
Comparison: 06/30/2010

CLINICAL DATA: Low back pain

EXAM:
LUMBAR SPINE - COMPLETE 4+ VIEW

[view not recorded (1 of 5)]
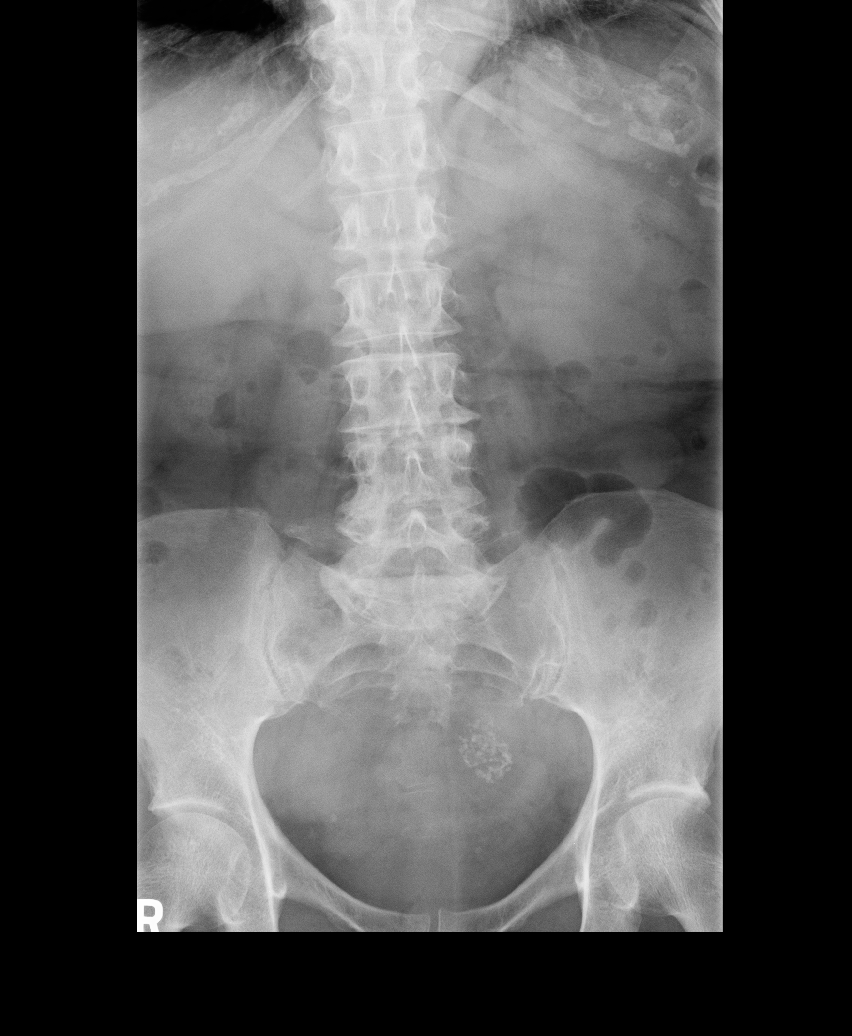

[view not recorded (2 of 5)]
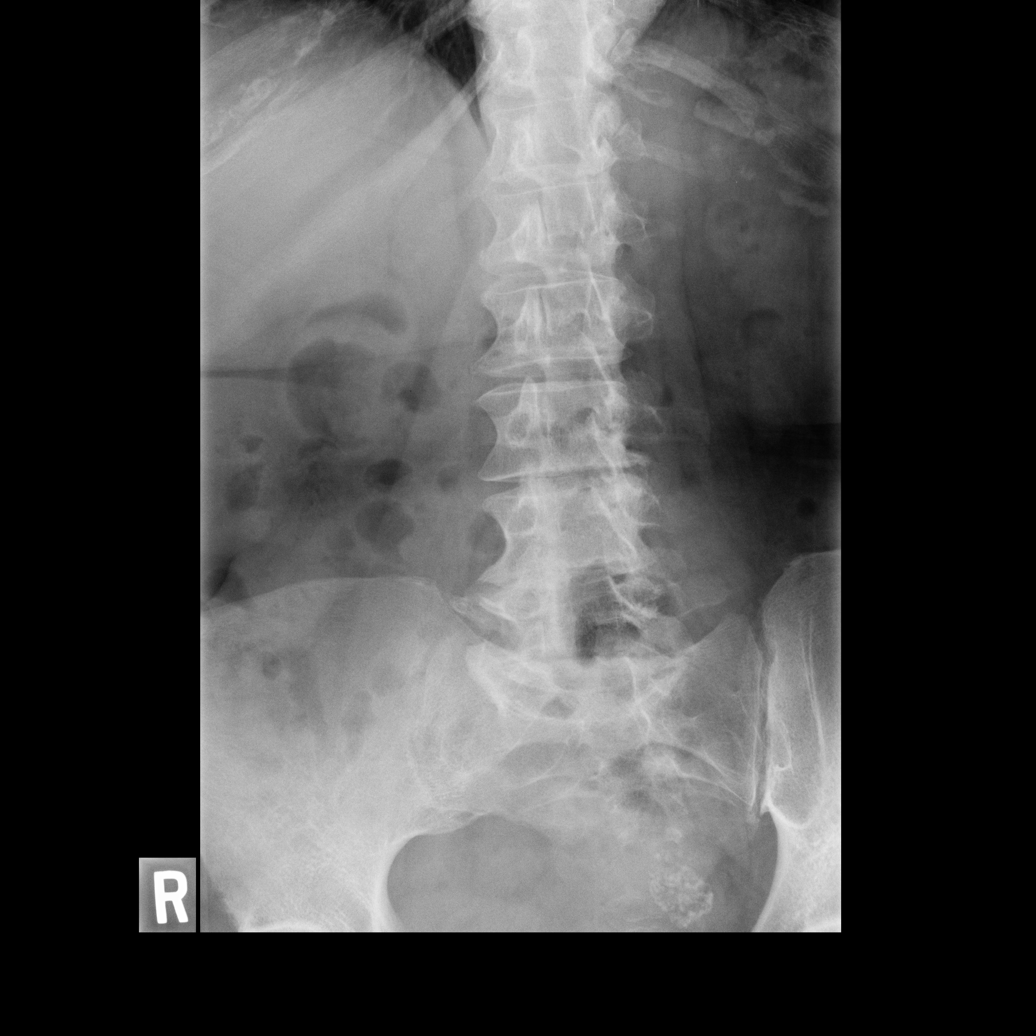

[view not recorded (3 of 5)]
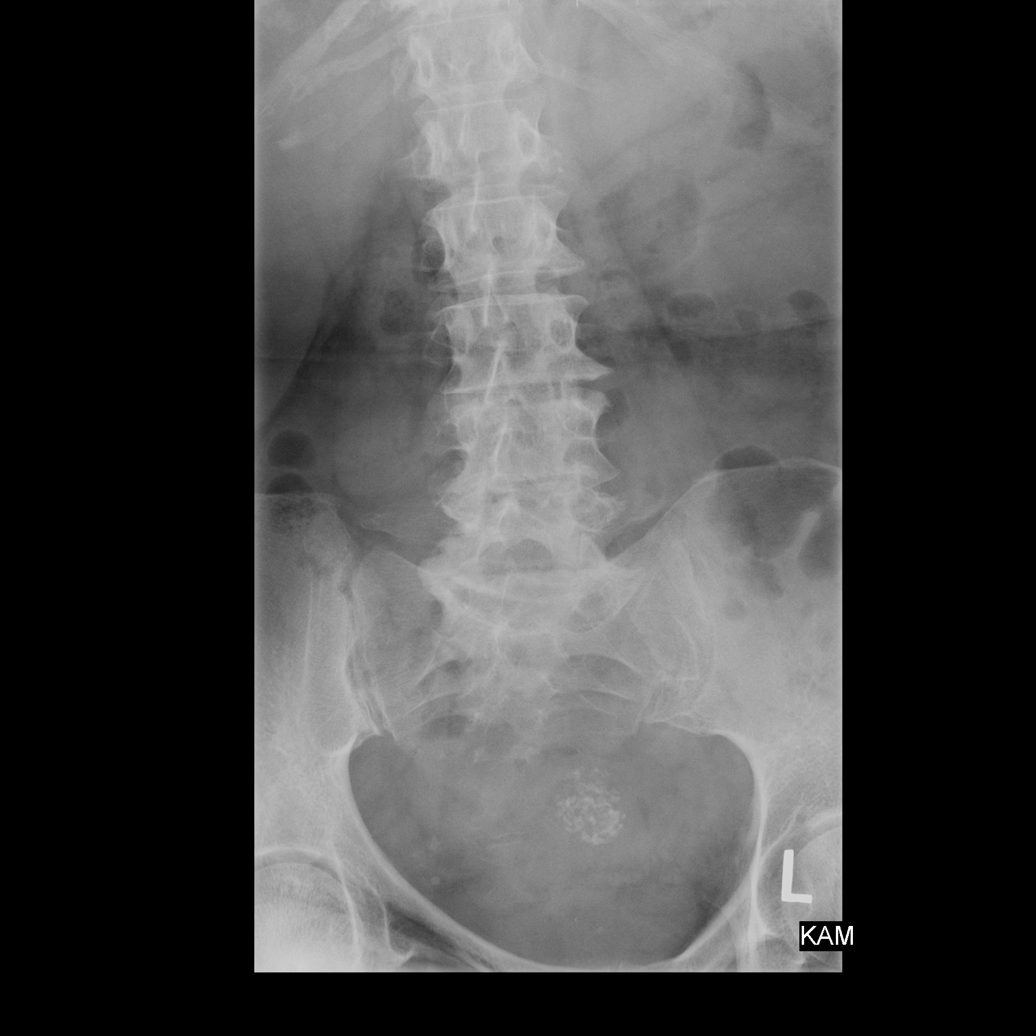

[view not recorded (4 of 5)]
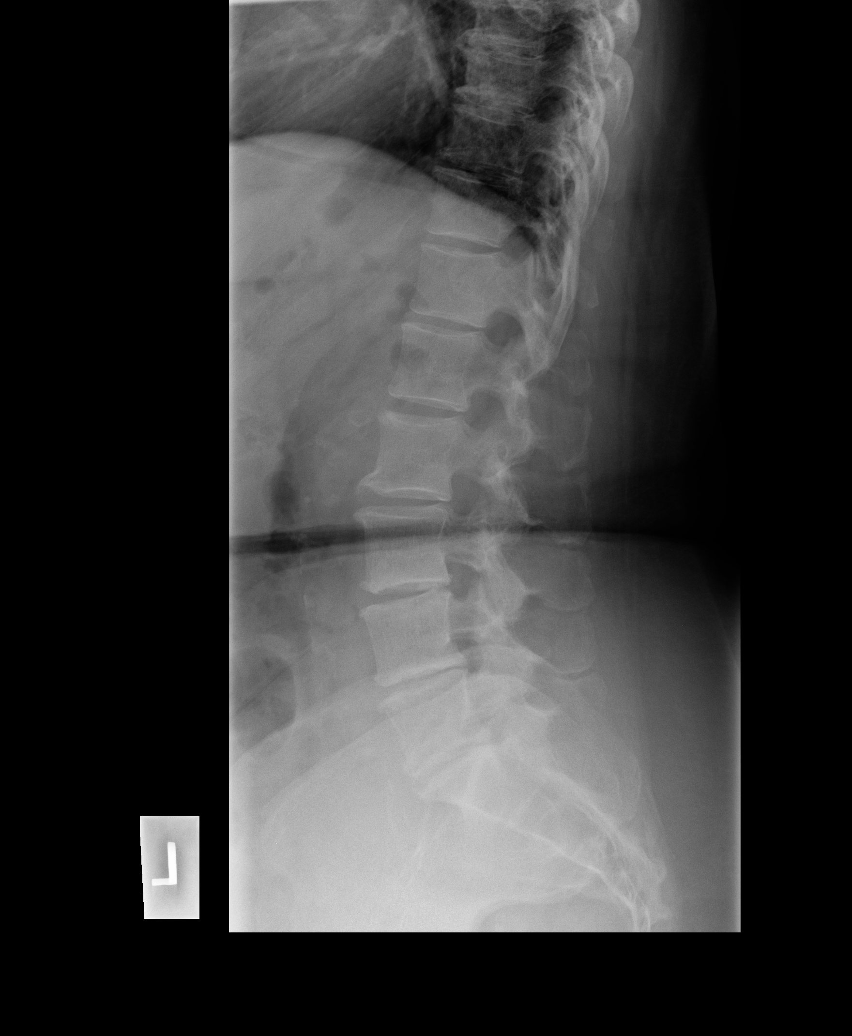

[view not recorded (5 of 5)]
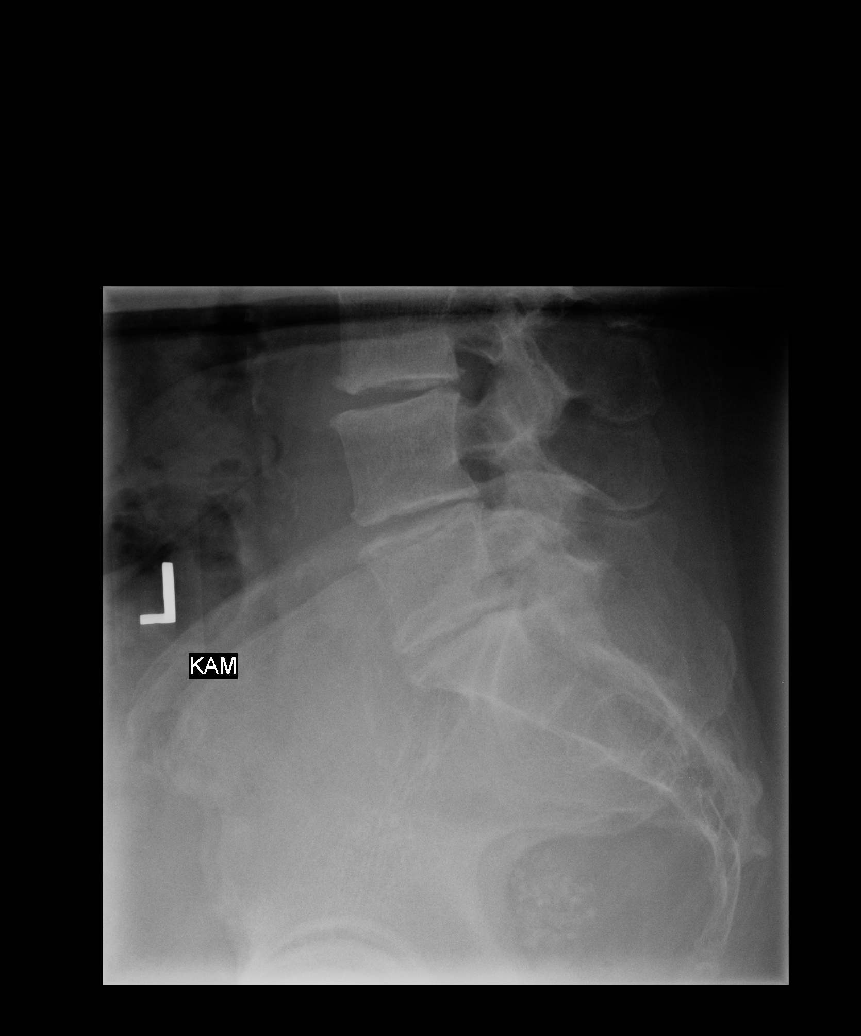

[5 of 5 positions shown; findings below may reference images not displayed]

FINDINGS: Anatomic alignment. No vertebral compression. Severe disc space
narrowing throughout the lumbar spine from L2-3 through L5-S1. No
obvious pars defect. Calcified uterine fibroid projects over the
left hemipelvis.
IMPRESSION: Degenerative change.  No acute bony pathology.

## 2016-08-28 ENCOUNTER — Encounter (HOSPITAL_COMMUNITY): Payer: Self-pay

## 2016-08-28 ENCOUNTER — Ambulatory Visit (HOSPITAL_COMMUNITY)
Admission: RE | Admit: 2016-08-28 | Discharge: 2016-08-28 | Disposition: A | Discharge status: Home / Self Care | Payer: Medicare Other | Source: Ambulatory Visit | Attending: Cardiology | Admitting: Cardiology

## 2016-08-28 VITALS — BP 150/70 | HR 68 | Wt 169.0 lb

## 2016-08-28 DIAGNOSIS — I10 Essential (primary) hypertension: Secondary | ICD-10-CM | POA: Insufficient documentation

## 2016-08-28 DIAGNOSIS — Z801 Family history of malignant neoplasm of trachea, bronchus and lung: Secondary | ICD-10-CM | POA: Insufficient documentation

## 2016-08-28 DIAGNOSIS — I252 Old myocardial infarction: Secondary | ICD-10-CM | POA: Diagnosis not present

## 2016-08-28 DIAGNOSIS — R001 Bradycardia, unspecified: Secondary | ICD-10-CM | POA: Insufficient documentation

## 2016-08-28 DIAGNOSIS — I447 Left bundle-branch block, unspecified: Secondary | ICD-10-CM | POA: Diagnosis not present

## 2016-08-28 DIAGNOSIS — E119 Type 2 diabetes mellitus without complications: Secondary | ICD-10-CM | POA: Insufficient documentation

## 2016-08-28 DIAGNOSIS — Z7982 Long term (current) use of aspirin: Secondary | ICD-10-CM | POA: Insufficient documentation

## 2016-08-28 DIAGNOSIS — Z8249 Family history of ischemic heart disease and other diseases of the circulatory system: Secondary | ICD-10-CM | POA: Insufficient documentation

## 2016-08-28 DIAGNOSIS — I2510 Atherosclerotic heart disease of native coronary artery without angina pectoris: Secondary | ICD-10-CM | POA: Diagnosis not present

## 2016-08-28 DIAGNOSIS — Z823 Family history of stroke: Secondary | ICD-10-CM | POA: Diagnosis not present

## 2016-08-28 DIAGNOSIS — Z79899 Other long term (current) drug therapy: Secondary | ICD-10-CM | POA: Insufficient documentation

## 2016-08-28 DIAGNOSIS — Z7984 Long term (current) use of oral hypoglycemic drugs: Secondary | ICD-10-CM | POA: Insufficient documentation

## 2016-08-28 DIAGNOSIS — E785 Hyperlipidemia, unspecified: Secondary | ICD-10-CM | POA: Insufficient documentation

## 2016-08-28 DIAGNOSIS — Z803 Family history of malignant neoplasm of breast: Secondary | ICD-10-CM | POA: Insufficient documentation

## 2016-08-28 DIAGNOSIS — E039 Hypothyroidism, unspecified: Secondary | ICD-10-CM | POA: Diagnosis not present

## 2016-08-28 MED ORDER — AMLODIPINE BESYLATE 2.5 MG PO TABS: 2.5000 mg | ORAL_TABLET | Freq: Every day | ORAL | 11 refills | Status: DC

## 2016-08-28 NOTE — Patient Instructions (Signed)
Start Amlodipine 2.5 mg daily  We will contact you in 1 year to schedule your next appointment.

## 2016-08-29 ENCOUNTER — Telehealth (HOSPITAL_COMMUNITY): Payer: Self-pay | Admitting: *Deleted

## 2016-08-29 MED ORDER — HYDRALAZINE HCL 50 MG PO TABS
75.0000 mg | ORAL_TABLET | Freq: Two times a day (BID) | ORAL | 11 refills | Status: DC
Start: 2016-08-29 — End: 2020-01-04

## 2016-08-29 NOTE — Telephone Encounter (Signed)
Dr Aundra Dubin received pt's records from PCP which state pt was on Amlodipine in the past and it was stopped due to significant LE edema.  He would like pt to stop this medication and have pt increase Hydralazine to 75 mg BID instead.  Pt is aware and agreeable.  Pharmacy notified

## 2016-08-29 NOTE — Progress Notes (Signed)
Patient ID: [name], female   DOB: 10-10-1931, 83 y.o.   MRN: [id number] History of Present Illness: PCP:  Pattiann Solanki is a 83 y.o. female with a h/o DM2 and HTN who was admitted to [facility] 06/30/10 to 07/04/10 with a NSTEMI.  Cardiac cath demonstrated mid LAD 80% stenosis which was treated with a BMS.  Initial complaints were abdominal pain.  CT demonstrated diverticulosis but no acute findings.  LDL was 96.  ALT was 17.  She was placed on high dose statin.    She returns for yearly followup today. No exertional dyspnea or chest pain.  No orthopnea/PND.  No lightheadedness.  She is off losartan now due to elevated creatinine.  She has a history of edema with amlodipine.  BP is elevated at 150/70 today, SBP 150s when she checks at home.  Weight is down 1 lb compared to prior appointment.   Labs (9/17): K 4.2, creatinine 1.46, HCT 36.4, LDL 55, HDL 54 Labs (5/18): K 4.4, creatinine 1.49, LDL 40, LDL-P 388, HCT 39  ECG (personally reviewed): NSR, LBBB  ROS: All pertinent positives and negatives as in HPI, otherwise negative.   Past Medical History:  Diagnosis Date  . CAD (coronary artery disease)    a. s/p NSTEMI 06/30/10 tx with BMS to LAD;  b. cath 4/12: mLAD 80% (PCI), RCA 20%; EF 65-70%;  c. echo 1/11: EF 65%, mild MR  . Diverticulosis   . DM2 (diabetes mellitus, type 2) ([place])   . Dyslipidemia    Crestor started 4/12  . HTN (hypertension)   . Hypothyroidism   . LBBB (left bundle branch block)   . Myocardial infarction [facility])    Social History   Social History  . Marital status: Married    Spouse name: N/A  . Number of children: 3  . Years of education: N/A   Occupational History  . Retired    Social History Main Topics  . Smoking status: Never Smoker  . Smokeless tobacco: Never Used  . Alcohol use No  . Drug use: No  . Sexual activity: Not on file   Other Topics Concern  . Not on file   Social History Narrative   Caffeine daily    Family History   Problem Relation Age of Onset  . Stroke Mother   . Hypertension Mother   . Heart attack Father   . Breast cancer Sister   . Lung cancer Sister   . Colon cancer Neg Hx     Current Outpatient Prescriptions  Medication Sig Dispense Refill  . aspirin 81 MG tablet Take 81 mg by mouth daily.      Marland Kitchen atorvastatin (LIPITOR) 40 MG tablet Take 40 mg by mouth daily.      . Cholecalciferol (VITAMIN D3) 2000 UNITS TABS Take 2,000 Int'l Units by mouth daily.      Marland Kitchen levothyroxine (SYNTHROID, LEVOTHROID) 50 MCG tablet Take 50 mcg by mouth daily.      . sitaGLIPtin (JANUVIA) 50 MG tablet Take 50 mg by mouth daily.    Marland Kitchen triamterene-hydrochlorothiazide (DYAZIDE) 37.5-25 MG capsule Take 1 capsule by mouth daily.    . hydrALAZINE (APRESOLINE) 50 MG tablet Take 1.5 tablets (75 mg total) by mouth 2 (two) times daily. 90 tablet 11   No current facility-administered medications for this encounter.     No Known Allergies  Vital Signs: Vitals:   08/28/16 1453  BP: (!) 150/70  Pulse: 68  SpO2: 100%  Weight:  169 lb (76.7 kg)    PHYSICAL EXAM: Well nourished, well developed, in no acute distress  HEENT: normal  Neck: JVP 7 cm Cardiac:  normal S1, S2; RRR; no murmur.  Lungs:  CTAB Abd: soft, nontender, no hepatomegaly  Ext: no edema; no cyanosis or clubbing Skin: warm and dry  Neuro:  CNs 2-12 intact, no focal abnormalities noted  ASSESSMENT AND PLAN:  1) CAD: No ischemic symptoms.  Continue ASA 81 and statin.   2) HTN:  BP elevated.  She is now off losartan with elevated creatinine, no amlodipine with history of edema on amlodipine, no beta blocker with history of bradycardia.  I will have her increase hydralazine to 75 mg bid.  3) Dyslipidemia: Good lipids earlier this month.    4) Bradycardia: Resolved off beta blocker.    Followup 1 year.   Loralie Champagne 08/29/2016

## 2017-12-12 ENCOUNTER — Ambulatory Visit (HOSPITAL_COMMUNITY)
Admission: RE | Admit: 2017-12-12 | Discharge: 2017-12-12 | Disposition: A | Discharge status: Home / Self Care | Payer: Medicare Other | Source: Ambulatory Visit | Attending: Cardiology | Admitting: Cardiology

## 2017-12-12 VITALS — BP 144/92 | HR 77 | Wt 160.0 lb

## 2017-12-12 DIAGNOSIS — R0989 Other specified symptoms and signs involving the circulatory and respiratory systems: Secondary | ICD-10-CM | POA: Insufficient documentation

## 2017-12-12 DIAGNOSIS — E785 Hyperlipidemia, unspecified: Secondary | ICD-10-CM | POA: Insufficient documentation

## 2017-12-12 DIAGNOSIS — Z79890 Hormone replacement therapy (postmenopausal): Secondary | ICD-10-CM | POA: Diagnosis not present

## 2017-12-12 DIAGNOSIS — N183 Chronic kidney disease, stage 3 (moderate): Secondary | ICD-10-CM | POA: Diagnosis not present

## 2017-12-12 DIAGNOSIS — I447 Left bundle-branch block, unspecified: Secondary | ICD-10-CM | POA: Diagnosis not present

## 2017-12-12 DIAGNOSIS — Z823 Family history of stroke: Secondary | ICD-10-CM | POA: Diagnosis not present

## 2017-12-12 DIAGNOSIS — E1122 Type 2 diabetes mellitus with diabetic chronic kidney disease: Secondary | ICD-10-CM | POA: Insufficient documentation

## 2017-12-12 DIAGNOSIS — Z79899 Other long term (current) drug therapy: Secondary | ICD-10-CM | POA: Insufficient documentation

## 2017-12-12 DIAGNOSIS — R001 Bradycardia, unspecified: Secondary | ICD-10-CM | POA: Insufficient documentation

## 2017-12-12 DIAGNOSIS — Z8249 Family history of ischemic heart disease and other diseases of the circulatory system: Secondary | ICD-10-CM | POA: Diagnosis not present

## 2017-12-12 DIAGNOSIS — Z7982 Long term (current) use of aspirin: Secondary | ICD-10-CM | POA: Insufficient documentation

## 2017-12-12 DIAGNOSIS — I129 Hypertensive chronic kidney disease with stage 1 through stage 4 chronic kidney disease, or unspecified chronic kidney disease: Secondary | ICD-10-CM | POA: Insufficient documentation

## 2017-12-12 DIAGNOSIS — I2510 Atherosclerotic heart disease of native coronary artery without angina pectoris: Secondary | ICD-10-CM | POA: Diagnosis not present

## 2017-12-12 DIAGNOSIS — I252 Old myocardial infarction: Secondary | ICD-10-CM | POA: Insufficient documentation

## 2017-12-12 DIAGNOSIS — I10 Essential (primary) hypertension: Secondary | ICD-10-CM

## 2017-12-12 LAB — BASIC METABOLIC PANEL
Anion gap: 9 (ref 5–15)
BUN: 14 mg/dL (ref 8–23)
CHLORIDE: 102 mmol/L (ref 98–111)
CO2: 29 mmol/L (ref 22–32)
Calcium: 9.4 mg/dL (ref 8.9–10.3)
Creatinine, Ser: 1.37 mg/dL — ABNORMAL HIGH (ref 0.44–1.00)
GFR calc non Af Amer: 35 mL/min — ABNORMAL LOW (ref >60)
GFR, EST AFRICAN AMERICAN: 40 mL/min — AB (ref >60)
Glucose, Bld: 103 mg/dL — ABNORMAL HIGH (ref 70–99)
POTASSIUM: 3.7 mmol/L (ref 3.5–5.1)
SODIUM: 140 mmol/L (ref 135–145)

## 2017-12-12 LAB — LIPID PANEL
CHOL/HDL RATIO: 2 ratio
CHOLESTEROL: 106 mg/dL (ref 0–200)
HDL: 52 mg/dL (ref >40)
LDL Cholesterol: 45 mg/dL (ref 0–99)
Triglycerides: 46 mg/dL (ref <150)
VLDL: 9 mg/dL (ref 0–40)

## 2017-12-12 NOTE — Patient Instructions (Signed)
Labs today (will call for abnormal results, otherwise no news is good news)  Carotid doppler's have been ordered for you, their office will contact you to schedule appointment.  Please check your Blood pressure's everyday for 2 weeks.  Check about the same time each day and call our office after two weeks with your results.  Please call [phone], Option 5 for the Triage Nurse.    Follow up in 1 year, please call in July/August to schedule your appointment.  Please call [phone], Option 3.

## 2017-12-12 NOTE — Progress Notes (Signed)
hotdog

## 2017-12-13 NOTE — Progress Notes (Signed)
Patient ID: [name], female   DOB: 1931-09-28, 84 y.o.   MRN: [id number] History of Present Illness: PCP:  Gloristine Turrubiates is a 83 y.o. female with a h/o DM2 and HTN who was admitted to [facility] 06/30/10 to 07/04/10 with a NSTEMI.  Cardiac cath demonstrated mid LAD 80% stenosis which was treated with a BMS.  Initial complaints were abdominal pain.  CT demonstrated diverticulosis but no acute findings.    She returns for yearly followup today. No exertional dyspnea or chest pain.  Generally feeling good.  Weight down 9 lbs.  BP is high in the office today, she says that it is ok at home.   Labs (9/17): K 4.2, creatinine 1.46, HCT 36.4, LDL 55, HDL 54 Labs (5/18): K 4.4, creatinine 1.49, LDL 40, LDL-P 388, HCT 39  ECG (personally reviewed): NSR, LBBB  ROS: All pertinent positives and negatives as in HPI, otherwise negative.   Past Medical History:  Diagnosis Date  . CAD (coronary artery disease)    a. s/p NSTEMI 06/30/10 tx with BMS to LAD;  b. cath 4/12: mLAD 80% (PCI), RCA 20%; EF 65-70%;  c. echo 1/11: EF 65%, mild MR  . Diverticulosis   . DM2 (diabetes mellitus, type 2) ([place])   . Dyslipidemia    Crestor started 4/12  . HTN (hypertension)   . Hypothyroidism   . LBBB (left bundle branch block)   . Myocardial infarction [facility])    Social History   Socioeconomic History  . Marital status: Married    Spouse name: Not on file  . Number of children: 3  . Years of education: Not on file  . Highest education level: Not on file  Occupational History  . Occupation: Retired  Scientific laboratory technician  . Financial resource strain: Not on file  . Food insecurity:    Worry: Not on file    Inability: Not on file  . Transportation needs:    Medical: Not on file    Non-medical: Not on file  Tobacco Use  . Smoking status: Never Smoker  . Smokeless tobacco: Never Used  Substance and Sexual Activity  . Alcohol use: No  . Drug use: No  . Sexual activity: Not on file  Lifestyle  .  Physical activity:    Days per week: Not on file    Minutes per session: Not on file  . Stress: Not on file  Relationships  . Social connections:    Talks on phone: Not on file    Gets together: Not on file    Attends religious service: Not on file    Active member of club or organization: Not on file    Attends meetings of clubs or organizations: Not on file    Relationship status: Not on file  . Intimate partner violence:    Fear of current or ex partner: Not on file    Emotionally abused: Not on file    Physically abused: Not on file    Forced sexual activity: Not on file  Other Topics Concern  . Not on file  Social History Narrative   Caffeine daily    Family History  Problem Relation Age of Onset  . Stroke Mother   . Hypertension Mother   . Heart attack Father   . Breast cancer Sister   . Lung cancer Sister   . Colon cancer Neg Hx     Current Outpatient Medications  Medication Sig Dispense Refill  . aspirin 81  MG tablet Take 81 mg by mouth daily.      Marland Kitchen atorvastatin (LIPITOR) 40 MG tablet Take 40 mg by mouth daily.      . Cholecalciferol (VITAMIN D3) 2000 UNITS TABS Take 2,000 Int'l Units by mouth daily.      . hydrALAZINE (APRESOLINE) 50 MG tablet Take 1.5 tablets (75 mg total) by mouth 2 (two) times daily. 90 tablet 11  . levothyroxine (SYNTHROID, LEVOTHROID) 50 MCG tablet Take 50 mcg by mouth daily.      . sitaGLIPtin (JANUVIA) 50 MG tablet Take 50 mg by mouth daily.    Marland Kitchen triamterene-hydrochlorothiazide (DYAZIDE) 37.5-25 MG capsule Take 1 capsule by mouth daily.     No current facility-administered medications for this encounter.     No Known Allergies  Vital Signs: Vitals:   12/12/17 1119  BP: (!) 144/92  Pulse: 77  SpO2: 95%  Weight: 72.6 kg (160 lb)    PHYSICAL EXAM: General: NAD Neck: No JVD, no thyromegaly or thyroid nodule.  Lungs: Clear to auscultation bilaterally with normal respiratory effort. CV: Nondisplaced PMI.  Heart regular S1/S2 with  paradoxically split S2, no S3/S4, no murmur.  No peripheral edema.  Right carotid bruit.  Normal pedal pulses.  Abdomen: Soft, nontender, no hepatosplenomegaly, no distention.  Skin: Intact without lesions or rashes.  Neurologic: Alert and oriented x 3.  Psych: Normal affect. Extremities: No clubbing or cyanosis.  HEENT: Normal.   ASSESSMENT AND PLAN:  1) CAD: No ischemic symptoms.  Continue ASA 81 and statin.   2) HTN:  BP elevated.  She is now off losartan with elevated creatinine, no amlodipine with history of edema on amlodipine, no beta blocker with history of bradycardia.   - She will check her BP daily at home x 2 wks and call readings in to the office.  If BP elevated, will increase her hydralazine to 100 mg tid.   3) Dyslipidemia: Check lipids today.     4) Bradycardia: Resolved off beta blocker.   5) Right carotid bruit: Check carotid dopplers.  6) CKD: Stage 3.  BMET today.  7) LBBB: Chronic.   Followup 1 year.   Loralie Champagne 12/13/2017

## 2017-12-19 ENCOUNTER — Ambulatory Visit (HOSPITAL_COMMUNITY)
Admission: RE | Admit: 2017-12-19 | Discharge: 2017-12-19 | Disposition: A | Discharge status: Home / Self Care | Payer: Medicare Other | Source: Ambulatory Visit | Attending: Cardiovascular Disease | Admitting: Cardiovascular Disease

## 2017-12-19 DIAGNOSIS — R0989 Other specified symptoms and signs involving the circulatory and respiratory systems: Secondary | ICD-10-CM | POA: Insufficient documentation

## 2019-12-04 ENCOUNTER — Ambulatory Visit (HOSPITAL_COMMUNITY)
Admission: RE | Admit: 2019-12-04 | Discharge: 2019-12-04 | Disposition: A | Discharge status: Home / Self Care | Payer: Medicare Other | Source: Ambulatory Visit | Attending: Cardiology | Admitting: Cardiology

## 2019-12-04 ENCOUNTER — Encounter (HOSPITAL_COMMUNITY): Payer: Self-pay | Admitting: Cardiology

## 2019-12-04 ENCOUNTER — Other Ambulatory Visit: Payer: Self-pay

## 2019-12-04 VITALS — BP 156/78 | HR 52 | Wt 151.0 lb

## 2019-12-04 DIAGNOSIS — E785 Hyperlipidemia, unspecified: Secondary | ICD-10-CM | POA: Insufficient documentation

## 2019-12-04 DIAGNOSIS — I130 Hypertensive heart and chronic kidney disease with heart failure and stage 1 through stage 4 chronic kidney disease, or unspecified chronic kidney disease: Secondary | ICD-10-CM | POA: Insufficient documentation

## 2019-12-04 DIAGNOSIS — E1122 Type 2 diabetes mellitus with diabetic chronic kidney disease: Secondary | ICD-10-CM | POA: Diagnosis not present

## 2019-12-04 DIAGNOSIS — I252 Old myocardial infarction: Secondary | ICD-10-CM | POA: Insufficient documentation

## 2019-12-04 DIAGNOSIS — Z8249 Family history of ischemic heart disease and other diseases of the circulatory system: Secondary | ICD-10-CM | POA: Diagnosis not present

## 2019-12-04 DIAGNOSIS — I2510 Atherosclerotic heart disease of native coronary artery without angina pectoris: Secondary | ICD-10-CM | POA: Insufficient documentation

## 2019-12-04 DIAGNOSIS — N183 Chronic kidney disease, stage 3 unspecified: Secondary | ICD-10-CM | POA: Insufficient documentation

## 2019-12-04 DIAGNOSIS — Z7982 Long term (current) use of aspirin: Secondary | ICD-10-CM | POA: Insufficient documentation

## 2019-12-04 DIAGNOSIS — Z79899 Other long term (current) drug therapy: Secondary | ICD-10-CM | POA: Diagnosis not present

## 2019-12-04 DIAGNOSIS — I509 Heart failure, unspecified: Secondary | ICD-10-CM | POA: Diagnosis not present

## 2019-12-04 DIAGNOSIS — Z7984 Long term (current) use of oral hypoglycemic drugs: Secondary | ICD-10-CM | POA: Diagnosis not present

## 2019-12-04 DIAGNOSIS — I447 Left bundle-branch block, unspecified: Secondary | ICD-10-CM | POA: Diagnosis not present

## 2019-12-04 DIAGNOSIS — E039 Hypothyroidism, unspecified: Secondary | ICD-10-CM | POA: Diagnosis not present

## 2019-12-04 LAB — BASIC METABOLIC PANEL
Anion gap: 9 (ref 5–15)
BUN: 16 mg/dL (ref 8–23)
CO2: 26 mmol/L (ref 22–32)
Calcium: 9.7 mg/dL (ref 8.9–10.3)
Chloride: 105 mmol/L (ref 98–111)
Creatinine, Ser: 1.46 mg/dL — ABNORMAL HIGH (ref 0.44–1.00)
GFR calc Af Amer: 38 mL/min — ABNORMAL LOW (ref >60)
GFR calc non Af Amer: 32 mL/min — ABNORMAL LOW (ref >60)
Glucose, Bld: 79 mg/dL (ref 70–99)
Potassium: 4 mmol/L (ref 3.5–5.1)
Sodium: 140 mmol/L (ref 135–145)

## 2019-12-04 MED ORDER — SPIRONOLACTONE 25 MG PO TABS: 12.5000 mg | ORAL_TABLET | Freq: Every day | ORAL | 3 refills | Status: DC

## 2019-12-04 NOTE — Patient Instructions (Addendum)
START Spironolactone 12.5mg  (1/2 tablet) Daily  Labs done today, your results will be available in MyChart, we will contact you for abnormal readings.  Repeat labs in 1-2 weeks  Please call our office in March to schedule your follow up appointment with the APP clinic.  If you have any questions or concerns before your next appointment please send [place] a message through [place] or call our office at [phone].    TO LEAVE A MESSAGE FOR THE NURSE SELECT OPTION 2, PLEASE LEAVE A MESSAGE INCLUDING: . YOUR NAME . DATE OF BIRTH . CALL BACK NUMBER . REASON FOR CALL**this is important as we prioritize the call backs  [place] AS LONG AS YOU CALL BEFORE 4:00 PM  At the [facility], you and your health needs are our priority. As part of our continuing mission to provide you with exceptional heart care, we have created designated Provider Care Teams. These Care Teams include your primary Cardiologist (physician) and Advanced Practice Providers (APPs- Physician Assistants and Nurse Practitioners) who all work together to provide you with the care you need, when you need it.   You may see any of the following providers on your designated Care Team at your next follow up: Marland Kitchen Dr Glori Bickers . Dr Loralie Champagne . Darrick Grinder, NP . Lyda Jester, PA . Audry Riles, PharmD   Please be sure to bring in all your medications bottles to every appointment.

## 2019-12-06 NOTE — Progress Notes (Signed)
Patient ID: [name], female   DOB: 1931/06/02, 86 y.o.   MRN: [id number] History of Present Illness: PCP:  Dr. Sandi Mariscal Cardiology: Dr. Pennie Rushing is a 86 y.o. female with a h/o DM2 and HTN who was admitted to [facility] 06/30/10 to 07/04/10 with a NSTEMI.  Cardiac cath demonstrated mid LAD 80% stenosis which was treated with a BMS.  Initial complaints were abdominal pain.  CT demonstrated diverticulosis but no acute findings.    She returns for yearly followup today. Weight is down 9 lbs.  No exertional chest pain or dyspnea.  BP is high today and has been running 308M systolic at home when she checks.  No lightheadedness, syncope, or palpitations.   Labs (9/17): K 4.2, creatinine 1.46, HCT 36.4, LDL 55, HDL 54 Labs (5/18): K 4.4, creatinine 1.49, LDL 40, LDL-P 388, HCT 39 Labs (5/21): LDL 46, K 3.6, creatinine 1.18  ECG (5/21, personally reviewed): NSR, LBBB  ROS: All pertinent positives and negatives as in HPI, otherwise negative.   Past Medical History:  Diagnosis Date  . CAD (coronary artery disease)    a. s/p NSTEMI 06/30/10 tx with BMS to LAD;  b. cath 4/12: mLAD 80% (PCI), RCA 20%; EF 65-70%;  c. echo 1/11: EF 65%, mild MR  . Diverticulosis   . DM2 (diabetes mellitus, type 2) ([place])   . Dyslipidemia    Crestor started 4/12  . HTN (hypertension)   . Hypothyroidism   . LBBB (left bundle branch block)   . Myocardial infarction [facility])    Social History   Socioeconomic History  . Marital status: Married    Spouse name: Not on file  . Number of children: 3  . Years of education: Not on file  . Highest education level: Not on file  Occupational History  . Occupation: Retired  Tobacco Use  . Smoking status: Never Smoker  . Smokeless tobacco: Never Used  Substance and Sexual Activity  . Alcohol use: No  . Drug use: No  . Sexual activity: Not on file  Other Topics Concern  . Not on file  Social History Narrative   Caffeine daily    Social Determinants of  Health   Financial Resource Strain:   . Difficulty of Paying Living Expenses: Not on file  Food Insecurity:   . Worried About Charity fundraiser in the Last Year: Not on file  . Ran Out of Food in the Last Year: Not on file  Transportation Needs:   . Lack of Transportation (Medical): Not on file  . Lack of Transportation (Non-Medical): Not on file  Physical Activity:   . Days of Exercise per Week: Not on file  . Minutes of Exercise per Session: Not on file  Stress:   . Feeling of Stress : Not on file  Social Connections:   . Frequency of Communication with Friends and Family: Not on file  . Frequency of Social Gatherings with Friends and Family: Not on file  . Attends Religious Services: Not on file  . Active Member of Clubs or Organizations: Not on file  . Attends Archivist Meetings: Not on file  . Marital Status: Not on file  Intimate Partner Violence:   . Fear of Current or Ex-Partner: Not on file  . Emotionally Abused: Not on file  . Physically Abused: Not on file  . Sexually Abused: Not on file   Family History  Problem Relation Age of Onset  . Breast cancer  Sister   . Stroke Mother   . Hypertension Mother   . Heart attack Father   . Lung cancer Sister   . Colon cancer Neg Hx     Current Outpatient Medications  Medication Sig Dispense Refill  . aspirin 81 MG tablet Take 81 mg by mouth daily.      Marland Kitchen atorvastatin (LIPITOR) 40 MG tablet Take 40 mg by mouth daily.      . Cholecalciferol (VITAMIN D3) 2000 UNITS TABS Take 2,000 Int'l Units by mouth daily.      . hydrALAZINE (APRESOLINE) 50 MG tablet Take 1.5 tablets (75 mg total) by mouth 2 (two) times daily. (Patient taking differently: Take 75 mg by mouth 2 (two) times daily. Take 1 tab in the morning and 1 tab evening.) 90 tablet 11  . levothyroxine (SYNTHROID, LEVOTHROID) 50 MCG tablet Take 50 mcg by mouth daily.      . sitaGLIPtin (JANUVIA) 50 MG tablet Take 50 mg by mouth daily.    Marland Kitchen  triamterene-hydrochlorothiazide (DYAZIDE) 37.5-25 MG capsule Take 1 capsule by mouth daily.    Marland Kitchen spironolactone (ALDACTONE) 25 MG tablet Take 0.5 tablets (12.5 mg total) by mouth daily. 15 tablet 3   No current facility-administered medications for this encounter.    No Known Allergies  Vital Signs: Vitals:   12/04/19 1420  BP: (!) 156/78  Pulse: (!) 52  SpO2: 99%  Weight: 68.5 kg (151 lb)    PHYSICAL EXAM: General: NAD Neck: No JVD, no thyromegaly or thyroid nodule.  Lungs: Clear to auscultation bilaterally with normal respiratory effort. CV: Nondisplaced PMI.  Heart regular S1/S2, no S3/S4, no murmur.  No peripheral edema.  No carotid bruit.  Normal pedal pulses.  Abdomen: Soft, nontender, no hepatosplenomegaly, no distention.  Skin: Intact without lesions or rashes.  Neurologic: Alert and oriented x 3.  Psych: Normal affect. Extremities: No clubbing or cyanosis.  HEENT: Normal.   ASSESSMENT AND PLAN:  1) CAD: No ischemic symptoms.  Continue ASA 81 and statin.   2) HTN:  BP elevated.  She is now off losartan with elevated creatinine, no amlodipine with history of edema on amlodipine, no beta blocker with history of bradycardia.   - Continue hydralazine 100 mg bid (she cannot remember to take tid).  - Continue triamterene/HCTZ.  - Start spironolactone 12.5 mg daily for resistant HTN.  Will get BMET today and in 10 days.    3) Dyslipidemia: Good lipids in 5/21.    4) Bradycardia: Resolved off beta blocker.   5) CKD: Stage 3.  BMET today.  6) LBBB: Chronic.   Followup with HF pharmacist in 3 wks for BP medication titration.  See NP in 6 months.   Loralie Champagne 12/06/2019

## 2019-12-17 ENCOUNTER — Ambulatory Visit (HOSPITAL_COMMUNITY)
Admission: RE | Admit: 2019-12-17 | Discharge: 2019-12-17 | Disposition: A | Discharge status: Home / Self Care | Payer: Medicare Other | Source: Ambulatory Visit | Attending: Internal Medicine | Admitting: Internal Medicine

## 2019-12-17 ENCOUNTER — Other Ambulatory Visit: Payer: Self-pay

## 2019-12-17 DIAGNOSIS — I509 Heart failure, unspecified: Secondary | ICD-10-CM | POA: Insufficient documentation

## 2019-12-17 LAB — BASIC METABOLIC PANEL
Anion gap: 11 (ref 5–15)
BUN: 16 mg/dL (ref 8–23)
CO2: 25 mmol/L (ref 22–32)
Calcium: 9.7 mg/dL (ref 8.9–10.3)
Chloride: 104 mmol/L (ref 98–111)
Creatinine, Ser: 1.42 mg/dL — ABNORMAL HIGH (ref 0.44–1.00)
GFR calc Af Amer: 39 mL/min — ABNORMAL LOW (ref >60)
GFR calc non Af Amer: 34 mL/min — ABNORMAL LOW (ref >60)
Glucose, Bld: 76 mg/dL (ref 70–99)
Potassium: 4.6 mmol/L (ref 3.5–5.1)
Sodium: 140 mmol/L (ref 135–145)

## 2019-12-23 NOTE — Progress Notes (Signed)
PCP:  Dr. Sandi Mariscal Cardiology: Dr. Aundra Dubin  HPI:  [name] is a 86 y.o. female with a h/o DM2 and HTN who was admitted to [facility] 06/30/10 to 07/04/10 with a NSTEMI.  Cardiac cath demonstrated mid LAD 80% stenosis which was treated with a BMS.  Initial complaints were abdominal pain.  CT demonstrated diverticulosis but no acute findings.    Recently returned to HF Clinic for follow up on 12/04/19 with Dr. Aundra Dubin. Weight was down 9 lbs.  No exertional chest pain or dyspnea.  BP was high in clinic and had been running 947S systolic at home when she checked.  No lightheadedness, syncope, or palpitations.   Today she returns to HF clinic for pharmacist medication titration. At last visit with MD, spironolactone 12.5 mg daily was initiated for resistant hypertension. Overall she is feeling well today. No dizziness, lightheadedness, chest pain or palpitations. Her home SBPs have been running 120-145. BP in clinic today was 154/64. She states her BP is always higher at the physicians office. Taking all medications as prescribed and tolerating all medications.     HTN Medications: Hydralazine 100 mg BID Spironolactone 12.5 daily Triamterene-hydrochlorothiazide 37.5/25 mg daily  Has the patient been experiencing any side effects to the medications prescribed?  no  Does the patient have any problems obtaining medications due to transportation or finances?   no  Understanding of regimen: good Understanding of indications: good Potential of compliance: good Patient understands to avoid NSAIDs. Patient understands to avoid decongestants.    Pertinent Lab Values (12/17/2019):  Serum creatinine 1.42, BUN 16, Potassium 4.6, Sodium 140  Vital Signs:  Weight: 151.6 lbs (last clinic weight: 151 lbs)  Blood pressure: 154/64   Heart rate: 60   Assessment: 1) CAD: No ischemic symptoms.  Continue ASA 81 and statin.   2) HTN:  BP elevated at 154/64.  She is now off losartan with elevated  creatinine, no amlodipine with history of edema on amlodipine, no beta blocker with history of bradycardia.   - Continue hydralazine 100 mg BID (she cannot remember to take tid).  - Stop spironolactone. This medication has a contraindication with triamterene as they are both potassium-sparing diuretics and can increase the risk of hyperkalemia.  - Increase triamterene/HCTZ 37.5-25 mg to 2 tablets daily.  Repeat BMET in 1 week.   3) Dyslipidemia: Good lipids in 5/21.    4) Bradycardia: Resolved off beta blocker.   5) CKD: Stage 3.  BMET in 1 week.  6) LBBB: Chronic.    Plan: 1) Medication changes: Based on clinical presentation, vital signs and recent labs will stop spironolactone due to drug interaction with triamterene (both potassium-sparing diuretics) and increase triamterene/HCTZ to 2 tablets daily.  2) Labs: Repeat BMET in 1 week 3) Follow-up: 6 months with [facility]   Audry Riles, PharmD, BCPS, BCCP, CPP Heart Failure Clinic Pharmacist [phone]

## 2020-01-04 ENCOUNTER — Other Ambulatory Visit: Payer: Self-pay

## 2020-01-04 ENCOUNTER — Ambulatory Visit (HOSPITAL_COMMUNITY)
Admission: RE | Admit: 2020-01-04 | Discharge: 2020-01-04 | Disposition: A | Discharge status: Home / Self Care | Payer: Medicare Other | Source: Ambulatory Visit | Attending: Cardiology | Admitting: Cardiology

## 2020-01-04 VITALS — BP 154/64 | HR 60 | Wt 151.6 lb

## 2020-01-04 DIAGNOSIS — I129 Hypertensive chronic kidney disease with stage 1 through stage 4 chronic kidney disease, or unspecified chronic kidney disease: Secondary | ICD-10-CM | POA: Insufficient documentation

## 2020-01-04 DIAGNOSIS — I447 Left bundle-branch block, unspecified: Secondary | ICD-10-CM | POA: Diagnosis not present

## 2020-01-04 DIAGNOSIS — Z7901 Long term (current) use of anticoagulants: Secondary | ICD-10-CM | POA: Insufficient documentation

## 2020-01-04 DIAGNOSIS — I2510 Atherosclerotic heart disease of native coronary artery without angina pectoris: Secondary | ICD-10-CM | POA: Insufficient documentation

## 2020-01-04 DIAGNOSIS — E785 Hyperlipidemia, unspecified: Secondary | ICD-10-CM | POA: Insufficient documentation

## 2020-01-04 DIAGNOSIS — Z79899 Other long term (current) drug therapy: Secondary | ICD-10-CM | POA: Insufficient documentation

## 2020-01-04 DIAGNOSIS — I252 Old myocardial infarction: Secondary | ICD-10-CM | POA: Diagnosis not present

## 2020-01-04 DIAGNOSIS — I10 Essential (primary) hypertension: Secondary | ICD-10-CM

## 2020-01-04 MED ORDER — TRIAMTERENE-HCTZ 37.5-25 MG PO CAPS: 2.0000 | ORAL_CAPSULE | Freq: Every day | ORAL | 6 refills | Status: DC

## 2020-01-04 MED ORDER — HYDRALAZINE HCL 100 MG PO TABS
100.0000 mg | ORAL_TABLET | Freq: Two times a day (BID) | ORAL | 11 refills | Status: DC
Start: 2020-01-04 — End: 2024-02-12

## 2020-01-04 NOTE — Patient Instructions (Signed)
It was a pleasure seeing you today!  MEDICATIONS: -We are changing your medications today -Stop spironolactone -Increase triamterene-hydrochlorothiazide to 2 tablets daily -Call if you have questions about your medications.   NEXT APPOINTMENT: Return to clinic in 6 months with [facility]. Please call the clinic at [phone] to schedule.  In general, to take care of your heart failure: -Limit your fluid intake to 2 Liters (half-gallon) per day.   -Limit your salt intake to ideally 2-3 grams (2000-3000 mg) per day. -Weigh yourself daily and record, and bring that "weight diary" to your next appointment.  (Weight gain of 2-3 pounds in 1 day typically means fluid weight.) -The medications for your heart are to help your heart and help you live longer.   -Please contact us before stopping any of your heart medications.  Call the clinic at [phone] with questions or to reschedule future appointments.

## 2020-01-14 ENCOUNTER — Other Ambulatory Visit: Payer: Self-pay

## 2020-01-14 ENCOUNTER — Ambulatory Visit (HOSPITAL_COMMUNITY)
Admission: RE | Admit: 2020-01-14 | Discharge: 2020-01-14 | Disposition: A | Discharge status: Home / Self Care | Payer: Medicare Other | Source: Ambulatory Visit | Attending: Cardiology | Admitting: Cardiology

## 2020-01-14 DIAGNOSIS — I2510 Atherosclerotic heart disease of native coronary artery without angina pectoris: Secondary | ICD-10-CM | POA: Diagnosis not present

## 2020-01-14 LAB — BASIC METABOLIC PANEL
Anion gap: 12 (ref 5–15)
BUN: 19 mg/dL (ref 8–23)
CO2: 27 mmol/L (ref 22–32)
Calcium: 9.6 mg/dL (ref 8.9–10.3)
Chloride: 102 mmol/L (ref 98–111)
Creatinine, Ser: 1.56 mg/dL — ABNORMAL HIGH (ref 0.44–1.00)
GFR, Estimated: 30 mL/min — ABNORMAL LOW (ref >60)
Glucose, Bld: 139 mg/dL — ABNORMAL HIGH (ref 70–99)
Potassium: 3.8 mmol/L (ref 3.5–5.1)
Sodium: 141 mmol/L (ref 135–145)

## 2020-10-25 ENCOUNTER — Encounter (HOSPITAL_COMMUNITY): Payer: Self-pay | Admitting: Cardiology

## 2020-10-25 ENCOUNTER — Other Ambulatory Visit: Payer: Self-pay

## 2020-10-25 ENCOUNTER — Ambulatory Visit (HOSPITAL_COMMUNITY)
Admission: RE | Admit: 2020-10-25 | Discharge: 2020-10-25 | Disposition: A | Discharge status: Home / Self Care | Payer: Medicare Other | Source: Ambulatory Visit | Attending: Cardiology | Admitting: Cardiology

## 2020-10-25 VITALS — BP 152/80 | HR 60 | Ht 64.0 in | Wt 149.0 lb

## 2020-10-25 DIAGNOSIS — Z79899 Other long term (current) drug therapy: Secondary | ICD-10-CM | POA: Insufficient documentation

## 2020-10-25 DIAGNOSIS — N183 Chronic kidney disease, stage 3 unspecified: Secondary | ICD-10-CM | POA: Diagnosis not present

## 2020-10-25 DIAGNOSIS — E1122 Type 2 diabetes mellitus with diabetic chronic kidney disease: Secondary | ICD-10-CM | POA: Insufficient documentation

## 2020-10-25 DIAGNOSIS — Z7982 Long term (current) use of aspirin: Secondary | ICD-10-CM | POA: Diagnosis not present

## 2020-10-25 DIAGNOSIS — Z955 Presence of coronary angioplasty implant and graft: Secondary | ICD-10-CM | POA: Diagnosis not present

## 2020-10-25 DIAGNOSIS — R7989 Other specified abnormal findings of blood chemistry: Secondary | ICD-10-CM | POA: Diagnosis not present

## 2020-10-25 DIAGNOSIS — Z8249 Family history of ischemic heart disease and other diseases of the circulatory system: Secondary | ICD-10-CM | POA: Insufficient documentation

## 2020-10-25 DIAGNOSIS — R001 Bradycardia, unspecified: Secondary | ICD-10-CM | POA: Insufficient documentation

## 2020-10-25 DIAGNOSIS — I2510 Atherosclerotic heart disease of native coronary artery without angina pectoris: Secondary | ICD-10-CM | POA: Diagnosis not present

## 2020-10-25 DIAGNOSIS — I129 Hypertensive chronic kidney disease with stage 1 through stage 4 chronic kidney disease, or unspecified chronic kidney disease: Secondary | ICD-10-CM | POA: Insufficient documentation

## 2020-10-25 DIAGNOSIS — Z7984 Long term (current) use of oral hypoglycemic drugs: Secondary | ICD-10-CM | POA: Diagnosis not present

## 2020-10-25 DIAGNOSIS — I447 Left bundle-branch block, unspecified: Secondary | ICD-10-CM | POA: Insufficient documentation

## 2020-10-25 DIAGNOSIS — E785 Hyperlipidemia, unspecified: Secondary | ICD-10-CM | POA: Diagnosis not present

## 2020-10-25 LAB — LIPID PANEL
Cholesterol: 115 mg/dL (ref 0–200)
HDL: 59 mg/dL (ref >40)
LDL Cholesterol: 46 mg/dL (ref 0–99)
Total CHOL/HDL Ratio: 1.9 RATIO
Triglycerides: 48 mg/dL (ref <150)
VLDL: 10 mg/dL (ref 0–40)

## 2020-10-25 LAB — BASIC METABOLIC PANEL
Anion gap: 8 (ref 5–15)
BUN: 21 mg/dL (ref 8–23)
CO2: 29 mmol/L (ref 22–32)
Calcium: 9.5 mg/dL (ref 8.9–10.3)
Chloride: 103 mmol/L (ref 98–111)
Creatinine, Ser: 1.54 mg/dL — ABNORMAL HIGH (ref 0.44–1.00)
GFR, Estimated: 33 mL/min — ABNORMAL LOW (ref >60)
Glucose, Bld: 96 mg/dL (ref 70–99)
Potassium: 4.1 mmol/L (ref 3.5–5.1)
Sodium: 140 mmol/L (ref 135–145)

## 2020-10-25 NOTE — Patient Instructions (Signed)
EKG done today.  Labs done today. We will contact you only if your labs are abnormal.  No other medication changes were made. Please continue all current medications as prescribed.  Your physician recommends that you schedule a follow-up appointment in: 1 year. Please contact our office in June 2023 for a July appointment.   If you have any questions or concerns before your next appointment please send [place] a message through [place] Yocelyn Brocious or call our office at [phone].    TO LEAVE A MESSAGE FOR THE NURSE SELECT OPTION 2, PLEASE LEAVE A MESSAGE INCLUDING: YOUR NAME DATE OF BIRTH CALL BACK NUMBER REASON FOR CALL**this is important as we prioritize the call backs  YOU WILL RECEIVE A CALL BACK THE SAME DAY AS LONG AS YOU CALL BEFORE 4:00 PM   Do the following things EVERYDAY: Weigh yourself in the morning before breakfast. Write it down and keep it in a log. Take your medicines as prescribed Eat low salt foods--Limit salt (sodium) to 2000 mg per day.  Stay as active as you can everyday Limit all fluids for the day to less than 2 liters   At the [facility], you and your health needs are our priority. As part of our continuing mission to provide you with exceptional heart care, we have created designated Provider Care Teams. These Care Teams include your primary Cardiologist (physician) and Advanced Practice Providers (APPs- Physician Assistants and Nurse Practitioners) who all work together to provide you with the care you need, when you need it.   You may see any of the following providers on your designated Care Team at your next follow up: Dr Glori Bickers Dr Haynes Kerns, NP Lyda Jester, [place] Audry Riles, PharmD   Please be sure to bring in all your medications bottles to every appointment.

## 2020-10-26 NOTE — Progress Notes (Signed)
Patient ID: [name], female   DOB: 22-Jun-1931, 87 y.o.   MRN: [id number] History of Present Illness: PCP:  Dr. Sandi Mariscal Cardiology: Dr. Pennie Rushing is a 87 y.o. female with a h/o DM2 and HTN who was admitted to [facility] 06/30/10 to 07/04/10 with a NSTEMI.  Cardiac cath demonstrated mid LAD 80% stenosis which was treated with a BMS.  Initial complaints were abdominal pain.  CT demonstrated diverticulosis but no acute findings.    She returns for yearly followup today. Weight down 2 lbs.  BP high today but SBP runs 120s-130s when she checks at home (several times/week).  She can climb 1 flight of stairs without problems.  She goes shopping in [place] and [employer].  No exertional dyspnea or chest pain.    Labs (9/17): K 4.2, creatinine 1.46, HCT 36.4, LDL 55, HDL 54 Labs (5/18): K 4.4, creatinine 1.49, LDL 40, LDL-P 388, HCT 39 Labs (5/21): LDL 46, K 3.6, creatinine 1.18 Labs (10/21): K 3.8, creatinine 1.56  ECG (personally reviewed): NSR, LBBB  ROS: All pertinent positives and negatives as in HPI, otherwise negative.   Past Medical History:  Diagnosis Date   CAD (coronary artery disease)    a. s/p NSTEMI 06/30/10 tx with BMS to LAD;  b. cath 4/12: mLAD 80% (PCI), RCA 20%; EF 65-70%;  c. echo 1/11: EF 65%, mild MR   Diverticulosis    DM2 (diabetes mellitus, type 2) ([facility])    Dyslipidemia    Crestor started 4/12   HTN (hypertension)    Hypothyroidism    LBBB (left bundle branch block)    Myocardial infarction [facility])    Social History   Socioeconomic History   Marital status: Married    Spouse name: Not on file   Number of children: 3   Years of education: Not on file   Highest education level: Not on file  Occupational History   Occupation: Retired  Tobacco Use   Smoking status: Never   Smokeless tobacco: Never  Substance and Sexual Activity   Alcohol use: No   Drug use: No   Sexual activity: Not on file  Other Topics Concern   Not on file  Social History  Narrative   Caffeine daily    Social Determinants of Health   Financial Resource Strain: Not on file  Food Insecurity: Not on file  Transportation Needs: Not on file  Physical Activity: Not on file  Stress: Not on file  Social Connections: Not on file  Intimate Partner Violence: Not on file   Family History  Problem Relation Age of Onset   Breast cancer Sister    Stroke Mother    Hypertension Mother    Heart attack Father    Lung cancer Sister    Colon cancer Neg Hx     Current Outpatient Medications  Medication Sig Dispense Refill   aspirin 81 MG tablet Take 81 mg by mouth daily.       atorvastatin (LIPITOR) 40 MG tablet Take 40 mg by mouth daily.       Cholecalciferol (VITAMIN D3) 2000 UNITS TABS Take 2,000 Int'l Units by mouth daily.       hydrALAZINE (APRESOLINE) 100 MG tablet Take 1 tablet (100 mg total) by mouth 2 (two) times daily. 60 tablet 11   levothyroxine (SYNTHROID, LEVOTHROID) 50 MCG tablet Take 50 mcg by mouth daily.       sitaGLIPtin (JANUVIA) 50 MG tablet Take 50 mg by mouth daily.  fluocinolone (SYNALAR) 0.01 % external solution Apply topically at bedtime. Apply to scalp at bedtime (Patient not taking: Reported on 10/25/2020)     triamterene-hydrochlorothiazide (DYAZIDE) 37.5-25 MG capsule Take 2 each (2 capsules total) by mouth daily. (Patient taking differently: Take 1 capsule by mouth daily.) 60 capsule 6   No current facility-administered medications for this encounter.    No Known Allergies  Vital Signs: Vitals:   10/25/20 1423  BP: (!) 152/80  Pulse: 60  SpO2: 96%  Weight: 67.6 kg (149 lb)  Height: '5\' 4"'$  (1.626 m)    PHYSICAL EXAM: General: NAD Neck: No JVD, no thyromegaly or thyroid nodule.  Lungs: Clear to auscultation bilaterally with normal respiratory effort. CV: Nondisplaced PMI.  Heart regular S1/S2, no S3/S4, no murmur.  No peripheral edema.  No carotid bruit.  Normal pedal pulses.  Abdomen: Soft, nontender, no  hepatosplenomegaly, no distention.  Skin: Intact without lesions or rashes.  Neurologic: Alert and oriented x 3.  Psych: Normal affect. Extremities: No clubbing or cyanosis.  HEENT: Normal.   ASSESSMENT AND PLAN:  1) CAD: No ischemic symptoms.  Continue ASA 81 and statin.   2) HTN:  BP elevated here but has been ok at home.  She is now off losartan with elevated creatinine, no amlodipine with history of edema on amlodipine, no beta blocker with history of bradycardia, she got dizzy when she took spironolactone.  I will not make changes today.  - Continue hydralazine 100 mg bid (she cannot remember to take tid).  - Continue triamterene/HCTZ.     3) Dyslipidemia: Check lipids today.  4) Bradycardia: Resolved off beta blocker.   5) CKD: Stage 3.  BMET today.  6) LBBB: Chronic.   Followup 1 year.   Loralie Champagne 10/26/2020

## 2020-10-31 ENCOUNTER — Other Ambulatory Visit (HOSPITAL_COMMUNITY): Payer: Self-pay | Admitting: Cardiology

## 2021-09-13 ENCOUNTER — Encounter: Payer: Self-pay | Admitting: Gastroenterology

## 2021-09-24 ENCOUNTER — Inpatient Hospital Stay (HOSPITAL_COMMUNITY)
Admission: EM | Admit: 2021-09-24 | Discharge: 2021-09-26 | DRG: 392 | Disposition: A | Discharge status: Home / Self Care | Payer: Medicare Other | Attending: Internal Medicine | Admitting: Internal Medicine

## 2021-09-24 ENCOUNTER — Encounter (HOSPITAL_COMMUNITY): Payer: Self-pay | Admitting: Emergency Medicine

## 2021-09-24 ENCOUNTER — Emergency Department (HOSPITAL_COMMUNITY): Payer: Medicare Other

## 2021-09-24 ENCOUNTER — Other Ambulatory Visit: Payer: Self-pay

## 2021-09-24 DIAGNOSIS — E890 Postprocedural hypothyroidism: Secondary | ICD-10-CM | POA: Diagnosis present

## 2021-09-24 DIAGNOSIS — I447 Left bundle-branch block, unspecified: Secondary | ICD-10-CM | POA: Diagnosis present

## 2021-09-24 DIAGNOSIS — Z823 Family history of stroke: Secondary | ICD-10-CM

## 2021-09-24 DIAGNOSIS — Z801 Family history of malignant neoplasm of trachea, bronchus and lung: Secondary | ICD-10-CM

## 2021-09-24 DIAGNOSIS — Z7984 Long term (current) use of oral hypoglycemic drugs: Secondary | ICD-10-CM

## 2021-09-24 DIAGNOSIS — E1122 Type 2 diabetes mellitus with diabetic chronic kidney disease: Secondary | ICD-10-CM | POA: Diagnosis present

## 2021-09-24 DIAGNOSIS — Z803 Family history of malignant neoplasm of breast: Secondary | ICD-10-CM | POA: Diagnosis not present

## 2021-09-24 DIAGNOSIS — I252 Old myocardial infarction: Secondary | ICD-10-CM | POA: Diagnosis not present

## 2021-09-24 DIAGNOSIS — E039 Hypothyroidism, unspecified: Secondary | ICD-10-CM | POA: Diagnosis present

## 2021-09-24 DIAGNOSIS — J189 Pneumonia, unspecified organism: Secondary | ICD-10-CM

## 2021-09-24 DIAGNOSIS — K5720 Diverticulitis of large intestine with perforation and abscess without bleeding: Secondary | ICD-10-CM | POA: Diagnosis present

## 2021-09-24 DIAGNOSIS — N390 Urinary tract infection, site not specified: Secondary | ICD-10-CM | POA: Diagnosis present

## 2021-09-24 DIAGNOSIS — K5780 Diverticulitis of intestine, part unspecified, with perforation and abscess without bleeding: Secondary | ICD-10-CM | POA: Diagnosis not present

## 2021-09-24 DIAGNOSIS — R1032 Left lower quadrant pain: Secondary | ICD-10-CM | POA: Diagnosis present

## 2021-09-24 DIAGNOSIS — I2510 Atherosclerotic heart disease of native coronary artery without angina pectoris: Secondary | ICD-10-CM | POA: Diagnosis present

## 2021-09-24 DIAGNOSIS — E876 Hypokalemia: Secondary | ICD-10-CM | POA: Diagnosis not present

## 2021-09-24 DIAGNOSIS — E785 Hyperlipidemia, unspecified: Secondary | ICD-10-CM | POA: Diagnosis present

## 2021-09-24 DIAGNOSIS — Z7982 Long term (current) use of aspirin: Secondary | ICD-10-CM | POA: Diagnosis not present

## 2021-09-24 DIAGNOSIS — Z79899 Other long term (current) drug therapy: Secondary | ICD-10-CM | POA: Diagnosis not present

## 2021-09-24 DIAGNOSIS — I129 Hypertensive chronic kidney disease with stage 1 through stage 4 chronic kidney disease, or unspecified chronic kidney disease: Secondary | ICD-10-CM | POA: Diagnosis present

## 2021-09-24 DIAGNOSIS — Z8249 Family history of ischemic heart disease and other diseases of the circulatory system: Secondary | ICD-10-CM

## 2021-09-24 DIAGNOSIS — I10 Essential (primary) hypertension: Secondary | ICD-10-CM | POA: Diagnosis present

## 2021-09-24 DIAGNOSIS — Z79890 Hormone replacement therapy (postmenopausal): Secondary | ICD-10-CM | POA: Diagnosis not present

## 2021-09-24 DIAGNOSIS — Z9861 Coronary angioplasty status: Secondary | ICD-10-CM | POA: Diagnosis not present

## 2021-09-24 DIAGNOSIS — N184 Chronic kidney disease, stage 4 (severe): Secondary | ICD-10-CM | POA: Diagnosis present

## 2021-09-24 DIAGNOSIS — E119 Type 2 diabetes mellitus without complications: Secondary | ICD-10-CM

## 2021-09-24 HISTORY — DX: Diverticulitis of intestine, part unspecified, with perforation and abscess without bleeding: K57.80

## 2021-09-24 LAB — COMPREHENSIVE METABOLIC PANEL
ALT: 15 U/L (ref 0–44)
AST: 25 U/L (ref 15–41)
Albumin: 4.3 g/dL (ref 3.5–5.0)
Alkaline Phosphatase: 65 U/L (ref 38–126)
Anion gap: 11 (ref 5–15)
BUN: 18 mg/dL (ref 8–23)
CO2: 26 mmol/L (ref 22–32)
Calcium: 9.8 mg/dL (ref 8.9–10.3)
Chloride: 102 mmol/L (ref 98–111)
Creatinine, Ser: 1.43 mg/dL — ABNORMAL HIGH (ref 0.44–1.00)
GFR, Estimated: 35 mL/min — ABNORMAL LOW (ref >60)
Glucose, Bld: 123 mg/dL — ABNORMAL HIGH (ref 70–99)
Potassium: 4.2 mmol/L (ref 3.5–5.1)
Sodium: 139 mmol/L (ref 135–145)
Total Bilirubin: 1.1 mg/dL (ref 0.3–1.2)
Total Protein: 7.7 g/dL (ref 6.5–8.1)

## 2021-09-24 LAB — URINALYSIS, ROUTINE W REFLEX MICROSCOPIC
Bilirubin Urine: NEGATIVE
Glucose, UA: NEGATIVE mg/dL
Hgb urine dipstick: NEGATIVE
Ketones, ur: NEGATIVE mg/dL
Nitrite: NEGATIVE
Protein, ur: NEGATIVE mg/dL
Specific Gravity, Urine: 1.013 (ref 1.005–1.030)
pH: 7 (ref 5.0–8.0)

## 2021-09-24 LAB — LIPASE, BLOOD: Lipase: 44 U/L (ref 11–51)

## 2021-09-24 LAB — CBC
HCT: 43.7 % (ref 36.0–46.0)
Hemoglobin: 14.5 g/dL (ref 12.0–15.0)
MCH: 31.8 pg (ref 26.0–34.0)
MCHC: 33.2 g/dL (ref 30.0–36.0)
MCV: 95.8 fL (ref 80.0–100.0)
Platelets: 241 10*3/uL (ref 150–400)
RBC: 4.56 MIL/uL (ref 3.87–5.11)
RDW: 13.6 % (ref 11.5–15.5)
WBC: 9.7 10*3/uL (ref 4.0–10.5)
nRBC: 0 % (ref 0.0–0.2)

## 2021-09-24 LAB — TROPONIN I (HIGH SENSITIVITY)
Troponin I (High Sensitivity): 16 ng/L (ref <18)
Troponin I (High Sensitivity): 19 ng/L — ABNORMAL HIGH (ref <18)

## 2021-09-24 LAB — CBG MONITORING, ED: Glucose-Capillary: 87 mg/dL (ref 70–99)

## 2021-09-24 MED ORDER — BISMUTH SUBSALICYLATE 262 MG/15ML PO SUSP: 30.0000 mL | Freq: Four times a day (QID) | ORAL | Status: DC | PRN

## 2021-09-24 MED ORDER — IOHEXOL 300 MG/ML  SOLN
80.0000 mL | Freq: Once | INTRAMUSCULAR | Status: AC | PRN
  Administered 2021-09-24: 80 mL via INTRAVENOUS

## 2021-09-24 MED ORDER — LEVOTHYROXINE SODIUM 50 MCG PO TABS
50.0000 ug | ORAL_TABLET | Freq: Every day | ORAL | Status: DC
  Administered 2021-09-24 – 2021-09-26 (×3): 50 ug via ORAL
  Filled 2021-09-24 (×3): qty 1

## 2021-09-24 MED ORDER — ACETAMINOPHEN 650 MG RE SUPP: 650.0000 mg | Freq: Four times a day (QID) | RECTAL | Status: DC | PRN

## 2021-09-24 MED ORDER — ACETAMINOPHEN 325 MG PO TABS: 650.0000 mg | ORAL_TABLET | Freq: Four times a day (QID) | ORAL | Status: DC | PRN

## 2021-09-24 MED ORDER — SODIUM CHLORIDE 0.9 % IV BOLUS
1000.0000 mL | Freq: Once | INTRAVENOUS | Status: AC
  Administered 2021-09-24: 1000 mL via INTRAVENOUS

## 2021-09-24 MED ORDER — ONDANSETRON HCL 4 MG PO TABS: 4.0000 mg | ORAL_TABLET | Freq: Four times a day (QID) | ORAL | Status: DC | PRN

## 2021-09-24 MED ORDER — ONDANSETRON 4 MG PO TBDP
4.0000 mg | ORAL_TABLET | Freq: Once | ORAL | Status: AC | PRN
  Administered 2021-09-24: 4 mg via ORAL
  Filled 2021-09-24: qty 1

## 2021-09-24 MED ORDER — HYDRALAZINE HCL 50 MG PO TABS
100.0000 mg | ORAL_TABLET | Freq: Two times a day (BID) | ORAL | Status: DC
  Administered 2021-09-24 – 2021-09-26 (×3): 100 mg via ORAL
  Filled 2021-09-24 (×3): qty 2

## 2021-09-24 MED ORDER — ONDANSETRON HCL 4 MG/2ML IJ SOLN: 4.0000 mg | Freq: Four times a day (QID) | INTRAMUSCULAR | Status: DC | PRN

## 2021-09-24 MED ORDER — INSULIN ASPART 100 UNIT/ML IJ SOLN
0.0000 [IU] | Freq: Four times a day (QID) | INTRAMUSCULAR | Status: DC
  Filled 2021-09-24: qty 0.09

## 2021-09-24 MED ORDER — SODIUM CHLORIDE 0.9 % IV SOLN
1.0000 g | Freq: Once | INTRAVENOUS | Status: AC
  Administered 2021-09-24: 1 g via INTRAVENOUS
  Filled 2021-09-24: qty 10

## 2021-09-24 MED ORDER — SODIUM CHLORIDE 0.9 % IV SOLN
INTRAVENOUS | Status: DC
Start: 2021-09-24 — End: 2021-09-26

## 2021-09-24 MED ORDER — MORPHINE SULFATE (PF) 2 MG/ML IV SOLN: 2.0000 mg | INTRAVENOUS | Status: DC | PRN

## 2021-09-24 MED ORDER — SODIUM CHLORIDE 0.9 % IV SOLN
500.0000 mg | Freq: Once | INTRAVENOUS | Status: AC
  Administered 2021-09-24: 500 mg via INTRAVENOUS
  Filled 2021-09-24: qty 5

## 2021-09-24 MED ORDER — SODIUM CHLORIDE 0.9 % IV SOLN
2.0000 g | INTRAVENOUS | Status: DC
  Administered 2021-09-25: 2 g via INTRAVENOUS
  Filled 2021-09-24: qty 20

## 2021-09-24 MED ORDER — HEPARIN SODIUM (PORCINE) 5000 UNIT/ML IJ SOLN
5000.0000 [IU] | Freq: Three times a day (TID) | INTRAMUSCULAR | Status: DC
  Administered 2021-09-24 – 2021-09-26 (×5): 5000 [IU] via SUBCUTANEOUS
  Filled 2021-09-24 (×5): qty 1

## 2021-09-24 MED ORDER — ASPIRIN 81 MG PO TBEC
81.0000 mg | DELAYED_RELEASE_TABLET | Freq: Every day | ORAL | Status: DC
  Administered 2021-09-25 – 2021-09-26 (×2): 81 mg via ORAL
  Filled 2021-09-24 (×2): qty 1

## 2021-09-24 MED ORDER — METRONIDAZOLE 500 MG/100ML IV SOLN
500.0000 mg | Freq: Two times a day (BID) | INTRAVENOUS | Status: DC
  Administered 2021-09-24 – 2021-09-26 (×4): 500 mg via INTRAVENOUS
  Filled 2021-09-24 (×4): qty 100

## 2021-09-24 NOTE — ED Provider Notes (Signed)
[facility]-EMERGENCY DEPT Provider Note   CSN: [id number] Arrival date & time: 09/24/21  1243     History  Chief Complaint  Patient presents with   Nausea   Dizziness    [name] is a 88 y.o. female.  Patient as above with significant medical history as below, including CAD, DM 2, HLD, HTN, LBBB who presents to the ED with complaint of fatigue, malaise, lower abdominal pain, concern for dehydration, feeling unwell.  Symptom onset yesterday.  No recent travel or sick contacts.  She took Pepto-Bismol yesterday which did improve her discomfort.  Pain primarily to her suprapubic and periumbilical abdominal region.  Described as aching, cramping, sharp.  Nonradiating.  She is having nausea without vomiting.  Patient reports reduced p.o. intake last 24 hours secondary to nausea.  No fevers or chills.  No recent medication or diet changes.  Subjective fever yesterday.  She is also having sensation of dizziness, feels as though the room is spinning.  She is experienced this in the past.  Dizziness is sudden onset.  Improves when seated or lying down.  Worsened by standing up or turning head quickly.  No diplopia.  No numbness or tingling, no focal weakness.   Past Medical History:  Diagnosis Date   CAD (coronary artery disease)    a. s/p NSTEMI 06/30/10 tx with BMS to LAD;  b. cath 4/12: mLAD 80% (PCI), RCA 20%; EF 65-70%;  c. echo 1/11: EF 65%, mild MR   Diverticulosis    DM2 (diabetes mellitus, type 2) (HCC)    Dyslipidemia    Crestor started 4/12   HTN (hypertension)    Hypothyroidism    LBBB (left bundle branch block)    Myocardial infarction [facility])     Past Surgical History:  Procedure Laterality Date   ABDOMINAL HYSTERECTOMY     THYROIDECTOMY     TUBAL LIGATION       The history is provided by the patient. No language interpreter was used.  Dizziness Associated symptoms: nausea   Associated symptoms: no chest pain, no headaches, no  palpitations, no shortness of breath and no vomiting        Home Medications Prior to Admission medications   Medication Sig Start Date End Date Taking? Authorizing Provider  aspirin 81 MG tablet Take 81 mg by mouth daily.     Yes [provider]  atorvastatin (LIPITOR) 40 MG tablet Take 40 mg by mouth daily.     Yes [provider]  bismuth subsalicylate (PEPTO BISMOL) 262 MG/15ML suspension Take 30 mLs by mouth every 6 (six) hours as needed for indigestion.   Yes [provider]  Cholecalciferol (VITAMIN D3) 2000 UNITS TABS Take 2,000 Int'l Units by mouth daily.     Yes [provider]  hydrALAZINE (APRESOLINE) 100 MG tablet Take 1 tablet (100 mg total) by mouth 2 (two) times daily. 01/04/20  Yes Laurey Morale, MD  levothyroxine (SYNTHROID, LEVOTHROID) 50 MCG tablet Take 50 mcg by mouth daily.     Yes [provider]  sitaGLIPtin (JANUVIA) 50 MG tablet Take 50 mg by mouth daily.   Yes [provider]  triamterene-hydrochlorothiazide (DYAZIDE) 37.5-25 MG capsule Take 2 each (2 capsules total) by mouth daily. Patient taking differently: Take 1 capsule by mouth daily. 01/04/20  Yes Laurey Morale, MD  betamethasone dipropionate 0.05 % lotion Apply 1 Application topically daily as needed for rash. 09/20/21   [provider]  Allergies    Patient has no known allergies.    Review of Systems   Review of Systems  Constitutional:  Positive for fatigue and fever. Negative for chills.  HENT:  Negative for facial swelling and trouble swallowing.   Eyes:  Negative for photophobia and visual disturbance.  Respiratory:  Negative for cough and shortness of breath.   Cardiovascular:  Negative for chest pain and palpitations.  Gastrointestinal:  Positive for abdominal pain and nausea. Negative for vomiting.  Endocrine: Negative for polydipsia and polyuria.  Genitourinary:  Negative for difficulty urinating and hematuria.   Musculoskeletal:  Negative for gait problem and joint swelling.  Skin:  Negative for pallor and rash.  Neurological:  Positive for dizziness. Negative for syncope and headaches.  Psychiatric/Behavioral:  Negative for agitation and confusion.     Physical Exam Updated Vital Signs BP (!) 133/58 (BP Location: Right Arm)   Pulse (!) 57   Temp 98.5 F (36.9 C) (Oral)   Resp 16   Ht 5\' 4"  (1.626 m)   Wt 70.3 kg   SpO2 96%   BMI 26.61 kg/m  Physical Exam Vitals and nursing note reviewed.  Constitutional:      General: She is not in acute distress.    Appearance: Normal appearance.  HENT:     Head: Normocephalic and atraumatic. No raccoon eyes, Battle's sign, right periorbital erythema or left periorbital erythema.     Jaw: There is normal jaw occlusion.     Right Ear: External ear normal.     Left Ear: External ear normal.     Nose: Nose normal.     Mouth/Throat:     Mouth: Mucous membranes are moist.  Eyes:     General: No scleral icterus.       Right eye: No discharge.        Left eye: No discharge.     Extraocular Movements: Extraocular movements intact.     Pupils: Pupils are equal, round, and reactive to light.  Cardiovascular:     Rate and Rhythm: Normal rate and regular rhythm.     Pulses: Normal pulses.     Heart sounds: Normal heart sounds.  Pulmonary:     Effort: Pulmonary effort is normal. No respiratory distress.     Breath sounds: Normal breath sounds.  Abdominal:     General: Abdomen is flat.     Tenderness: There is no abdominal tenderness.  Musculoskeletal:        General: Normal range of motion.     Cervical back: Normal range of motion.     Right lower leg: No edema.     Left lower leg: No edema.  Skin:    General: Skin is warm and dry.     Capillary Refill: Capillary refill takes less than 2 seconds.  Neurological:     Mental Status: She is alert and oriented to person, place, and time.     GCS: GCS eye subscore is 4. GCS verbal subscore is 5.  GCS motor subscore is 6.     Cranial Nerves: Cranial nerves 2-12 are intact. No dysarthria or facial asymmetry.     Sensory: Sensation is intact.     Motor: Motor function is intact. No tremor.     Coordination: Coordination is intact. Finger-Nose-Finger Test normal.     Comments: Gait not tested secondary to patient safety  Psychiatric:        Mood and Affect: Mood normal.  Behavior: Behavior normal.     ED Results / Procedures / Treatments   Labs (all labs ordered are listed, but only abnormal results are displayed) Labs Reviewed  COMPREHENSIVE METABOLIC PANEL - Abnormal; Notable for the following components:      Result Value   Glucose, Bld 123 (*)    Creatinine, Ser 1.43 (*)    GFR, Estimated 35 (*)    All other components within normal limits  URINALYSIS, ROUTINE W REFLEX MICROSCOPIC - Abnormal; Notable for the following components:   Color, Urine STRAW (*)    Leukocytes,Ua MODERATE (*)    Bacteria, UA RARE (*)    All other components within normal limits  TROPONIN I (HIGH SENSITIVITY) - Abnormal; Notable for the following components:   Troponin I (High Sensitivity) 19 (*)    All other components within normal limits  RESP PANEL BY RT-PCR (FLU A&B, COVID) ARPGX2  URINE CULTURE  LIPASE, BLOOD  CBC  BASIC METABOLIC PANEL  CBC  MAGNESIUM  CBG MONITORING, ED  TROPONIN I (HIGH SENSITIVITY)    EKG EKG Interpretation  Date/Time:  Sunday September 24 2021 12:52:40 EDT Ventricular Rate:  71 PR Interval:  128 QRS Duration: 137 QT Interval:  421 QTC Calculation: 458 R Axis:   -54 Text Interpretation: Sinus rhythm Multiple premature complexes, vent & supraven Left bundle branch block Similar to prior, no STEMI Confirmed by Tanda Rockers ([id number]) on 09/24/2021 1:51:58 PM  Radiology CT ABDOMEN PELVIS W CONTRAST  Result Date: 09/24/2021 CLINICAL DATA:  Acute abdominal pain and nausea beginning last night. Dizziness. EXAM: CT ABDOMEN AND PELVIS WITH CONTRAST TECHNIQUE:  Multidetector CT imaging of the abdomen and pelvis was performed using the standard protocol following bolus administration of intravenous contrast. RADIATION DOSE REDUCTION: This exam was performed according to the departmental dose-optimization program which includes automated exposure control, adjustment of the mA and/or kV according to patient size and/or use of iterative reconstruction technique. CONTRAST:  80mL OMNIPAQUE IOHEXOL 300 MG/ML  SOLN COMPARISON:  Noncontrast CT on 06/30/2010 FINDINGS: Lower Chest: No acute findings. Hepatobiliary: No hepatic masses identified. Gallbladder is unremarkable. No evidence of biliary ductal dilatation. Pancreas:  No mass or inflammatory changes. Spleen: Within normal limits in size and appearance. Adrenals/Urinary Tract: No masses identified. 7 cm benign left renal cyst again noted (no followup imaging recommended) no evidence of ureteral calculi or hydronephrosis. Stomach/Bowel: Tiny hiatal hernia is seen. Diffuse colonic diverticulosis is demonstrated, with moderate diverticulitis involving the proximal sigmoid colon. A small diverticular abscess is seen measuring 1.9 cm. Tiny amount of free fluid also noted within the pelvis. Vascular/Lymphatic: No pathologically enlarged lymph nodes. No acute vascular findings. Aortic atherosclerotic calcification incidentally noted. Reproductive: 2.2 cm calcified fibroid again seen in the left posterior uterine wall. No adnexal mass identified. Other:  None. Musculoskeletal:  No suspicious bone lesions identified. IMPRESSION: Moderate sigmoid diverticulitis, with 2 cm diverticular abscess and tiny amount of free fluid. 2 cm calcified uterine fibroid. Tiny hiatal hernia. Aortic Atherosclerosis (ICD10-I70.0). Electronically Signed   By: Danae Orleans M.D.   On: 09/24/2021 15:00   DG Chest Portable 1 View  Result Date: 09/24/2021 CLINICAL DATA:  Fatigue. EXAM: PORTABLE CHEST 1 VIEW COMPARISON:  None Available. FINDINGS: Cardiac  enlargement without heart failure. Atherosclerotic calcification aortic arch. Right lung clear Mild left lower lobe airspace disease likely atelectasis. IMPRESSION: Cardiac enlargement without heart failure Mild left lower lobe atelectasis/infiltrate. Electronically Signed   By: Marlan Palau M.D.   On: 09/24/2021 14:40  Procedures .Critical Care  Performed by: Sloan Leiter, DO Authorized by: Sloan Leiter, DO   Critical care provider statement:    Critical care time (minutes):  30   Critical care time was exclusive of:  Separately billable procedures and treating other patients   Critical care was necessary to treat or prevent imminent or life-threatening deterioration of the following conditions:  Cardiac failure (multiple infections requiring iv abx; trop +)   Critical care was time spent personally by me on the following activities:  Development of treatment plan with patient or surrogate, discussions with consultants, evaluation of patient's response to treatment, examination of patient, ordering and review of laboratory studies, ordering and review of radiographic studies, ordering and performing treatments and interventions, pulse oximetry, re-evaluation of patient's condition, review of old charts and obtaining history from patient or surrogate   Care discussed with: admitting provider       Medications Ordered in ED Medications  metroNIDAZOLE (FLAGYL) IVPB 500 mg (0 mg Intravenous Stopped 09/24/21 1753)  cefTRIAXone (ROCEPHIN) 2 g in sodium chloride 0.9 % 100 mL IVPB (has no administration in time range)  insulin aspart (novoLOG) injection 0-9 Units ( Subcutaneous Not Given 09/24/21 1750)  0.9 %  sodium chloride infusion ( Intravenous Infusion Verify 09/24/21 2228)  aspirin EC tablet 81 mg (has no administration in time range)  bismuth subsalicylate (PEPTO BISMOL) 262 MG/15ML suspension 30 mL (has no administration in time range)  hydrALAZINE (APRESOLINE) tablet 100 mg (100 mg  Oral Given 09/24/21 2226)  levothyroxine (SYNTHROID) tablet 50 mcg (50 mcg Oral Given 09/24/21 1955)  heparin injection 5,000 Units (5,000 Units Subcutaneous Given 09/24/21 2228)  acetaminophen (TYLENOL) tablet 650 mg (has no administration in time range)    Or  acetaminophen (TYLENOL) suppository 650 mg (has no administration in time range)  morphine (PF) 2 MG/ML injection 2 mg (has no administration in time range)  ondansetron (ZOFRAN) tablet 4 mg (has no administration in time range)    Or  ondansetron (ZOFRAN) injection 4 mg (has no administration in time range)  ondansetron (ZOFRAN-ODT) disintegrating tablet 4 mg (4 mg Oral Given 09/24/21 1303)  sodium chloride 0.9 % bolus 1,000 mL (0 mLs Intravenous Stopped 09/24/21 1600)  iohexol (OMNIPAQUE) 300 MG/ML solution 80 mL (80 mLs Intravenous Contrast Given 09/24/21 1440)  cefTRIAXone (ROCEPHIN) 1 g in sodium chloride 0.9 % 100 mL IVPB (0 g Intravenous Stopped 09/24/21 1631)  azithromycin (ZITHROMAX) 500 mg in sodium chloride 0.9 % 250 mL IVPB (0 mg Intravenous Stopped 09/24/21 1544)    ED Course/ Medical Decision Making/ A&P                           Medical Decision Making Amount and/or Complexity of Data Reviewed Labs: ordered. Radiology: ordered.  Risk Prescription drug management. Decision regarding hospitalization.    CC: Abdominal pain, nausea, vomiting  This patient presents to the Emergency Department for the above complaint. This involves an extensive number of treatment options and is a complaint that carries with it a high risk of complications and morbidity. Vital signs were reviewed. Serious etiologies considered.  Differential diagnosis includes but is not exclusive to ectopic pregnancy, ovarian cyst, ovarian torsion, acute appendicitis, urinary tract infection, endometriosis, bowel obstruction, hernia, colitis, renal colic, gastroenteritis, volvulus, infectious abnormality etc.  Record review:  Previous records obtained  and reviewed  Prior visits, prior labs and imaging  Additional history obtained from N/A  Medical and surgical history  as noted above.   Work up as above, notable for:  Labs & imaging results that were available during my care of the patient were visualized by me and considered in my medical decision making.   I ordered imaging studies which included chest x-ray, CT abdomen pelvis. I visualized the imaging, interpreted images, and I agree with radiologist interpretation.  Pneumonia, diverticulitis with abscess;  2 cm.  Labs reviewed, urinalysis concerning for infection.  Send urine culture.  Start Rocephin.  Creatinine is similar to her baseline.  She has mild elevation to her troponin.  No ECG changes.  No chest pain.  Labs otherwise stable.  No leukocytosis.  Imaging concerning for infection, start Rocephin, Flagyl.  Also give azithromycin for possible pneumonia.  She is breathing comfortably on ambient air.  She is not hypoxic.  Personally discussed patient care with consultant; Barrie Dunker with Gen surg; will see in consult in regards to diverticular abscess.  Management: Broad-spectrum antibiotics  ED Course:     Reassessment:  Symptoms improved.  Admission was considered.   Patient with presumed pneumonia, UTI and diverticulitis complicated.  Recommend admission.  Patient agreeable.  Discussed with Dr.Ghimire who accepts pt for admission           Social determinants of health include -  Social History   Socioeconomic History   Marital status: Married    Spouse name: Not on file   Number of children: 3   Years of education: Not on file   Highest education level: Not on file  Occupational History   Occupation: Retired  Tobacco Use   Smoking status: Never   Smokeless tobacco: Never  Substance and Sexual Activity   Alcohol use: No   Drug use: No   Sexual activity: Not on file  Other Topics Concern   Not on file  Social History Narrative   Caffeine  daily    Social Determinants of Health   Financial Resource Strain: Not on file  Food Insecurity: Not on file  Transportation Needs: Not on file  Physical Activity: Not on file  Stress: Not on file  Social Connections: Not on file  Intimate Partner Violence: Not on file      This chart was dictated using voice recognition software.  Despite best efforts to proofread,  errors can occur which can change the documentation meaning.         Final Clinical Impression(s) / ED Diagnoses Final diagnoses:  Lower urinary tract infectious disease  Diverticulitis of intestine with abscess without bleeding, unspecified part of intestinal tract  Community acquired pneumonia, unspecified laterality    Rx / DC Orders ED Discharge Orders     None         Sloan Leiter, DO 09/25/21 0000

## 2021-09-24 NOTE — H&P (Signed)
History and Physical    [name] VHQ:[id number] DOB: April 28, 1931 DOA: 09/24/2021  PCP: Mosetta Putt, MD  Patient coming from: Home  I have personally briefly reviewed patient's old medical records available.   Chief Complaint: Not feeling well, nausea for 2 weeks, lower abdominal pain for 1 day.  HPI: [name] is a 88 y.o. female with medical history significant of type 2 diabetes on oral hypoglycemics, hypertension, chronic left bundle branch block, hypothyroidism, coronary artery disease status post stent currently on aspirin, CKD stage IV and hyperlipidemia, history of diverticulosis presents to the emergency room with about 2 weeks of not feeling well and fatigue, 1 day of nausea and diffuse suprapubic pain.  Patient stated that she might have low-grade fever, she just felt weak all since last 2 weeks, yesterday morning she started having diffuse, mild, dull ache, suprapubic discomfort, about 4 out of 10 in intensity not relieved by Pepto-Bismol.  No radiation of the pain. Last bowel movement yesterday and normal. Nausea and poor appetite but no vomiting. Since yesterday, complained of feeling weak, dizzy and lightheaded. No fever. Denies any cough, cold symptoms, wheezing or shortness of breath. She did have mild left-sided chest discomfort today morning, none present now. Denies history of diverticulitis.  Patient stated that she had a colonoscopy 1 year ago and is not aware of any abnormal findings. ED Course: Blood pressure stable.  On room air.  WBC count normal.  Troponins normal.  EKG with chronic findings.  Chest x-ray with left lower lobe atelectasis, patient with no respiratory symptoms. CT scan of the abdomen pelvis with mild diverticulitis, 2 cm contained abscess.  Started on Rocephin azithromycin and Flagyl, surgery consulted.  Admission requested.  Review of Systems: all systems are reviewed and pertinent positive as per HPI otherwise rest are  negative.    Past Medical History:  Diagnosis Date   CAD (coronary artery disease)    a. s/p NSTEMI 06/30/10 tx with BMS to LAD;  b. cath 4/12: mLAD 80% (PCI), RCA 20%; EF 65-70%;  c. echo 1/11: EF 65%, mild MR   Diverticulosis    DM2 (diabetes mellitus, type 2) (HCC)    Dyslipidemia    Crestor started 4/12   HTN (hypertension)    Hypothyroidism    LBBB (left bundle branch block)    Myocardial infarction [facility])     Past Surgical History:  Procedure Laterality Date   ABDOMINAL HYSTERECTOMY     THYROIDECTOMY     TUBAL LIGATION      Social history   reports that she has never smoked. She has never used smokeless tobacco. She reports that she does not drink alcohol and does not use drugs.  No Known Allergies  Family History  Problem Relation Age of Onset   Breast cancer Sister    Stroke Mother    Hypertension Mother    Heart attack Father    Lung cancer Sister    Colon cancer Neg Hx      Prior to Admission medications   Medication Sig Start Date End Date Taking? Authorizing Provider  aspirin 81 MG tablet Take 81 mg by mouth daily.     Yes [provider]  atorvastatin (LIPITOR) 40 MG tablet Take 40 mg by mouth daily.     Yes [provider]  bismuth subsalicylate (PEPTO BISMOL) 262 MG/15ML suspension Take 30 mLs by mouth every 6 (six) hours as needed for indigestion.   Yes [provider]  Cholecalciferol (  VITAMIN D3) 2000 UNITS TABS Take 2,000 Int'l Units by mouth daily.     Yes [provider]  hydrALAZINE (APRESOLINE) 100 MG tablet Take 1 tablet (100 mg total) by mouth 2 (two) times daily. 01/04/20  Yes Laurey Morale, MD  levothyroxine (SYNTHROID, LEVOTHROID) 50 MCG tablet Take 50 mcg by mouth daily.     Yes [provider]  sitaGLIPtin (JANUVIA) 50 MG tablet Take 50 mg by mouth daily.   Yes [provider]  triamterene-hydrochlorothiazide (DYAZIDE) 37.5-25 MG capsule Take 2 each (2 capsules total) by mouth  daily. Patient taking differently: Take 1 capsule by mouth daily. 01/04/20  Yes Laurey Morale, MD  betamethasone dipropionate 0.05 % lotion Apply 1 Application topically daily as needed for rash. 09/20/21   [provider]    Physical Exam: Vitals:   09/24/21 1530 09/24/21 1600 09/24/21 1630 09/24/21 1730  BP: (!) 151/71 (!) 147/87 128/73 (!) 129/113  Pulse: 67 64 71 69  Resp: 17 (!) 23 (!) 22 (!) 23  Temp:      TempSrc:      SpO2: 99% 100% 93% 99%  Weight:      Height:        Constitutional: NAD, calm, comfortable, pleasant. Vitals:   09/24/21 1530 09/24/21 1600 09/24/21 1630 09/24/21 1730  BP: (!) 151/71 (!) 147/87 128/73 (!) 129/113  Pulse: 67 64 71 69  Resp: 17 (!) 23 (!) 22 (!) 23  Temp:      TempSrc:      SpO2: 99% 100% 93% 99%  Weight:      Height:       Eyes: PERRL, lids and conjunctivae normal ENMT: Mucous membranes are dry. Posterior pharynx clear of any exudate or lesions.Normal dentition.  Neck: normal, supple, no masses, no thyromegaly Respiratory: clear to auscultation bilaterally, no wheezing, no crackles. Normal respiratory effort. No accessory muscle use.  No other added sounds. Cardiovascular: Regular rate and rhythm, no murmurs / rubs / gallops. No extremity edema. 2+ pedal pulses. No carotid bruits.  Abdomen: Mild tenderness along the left suprapubic area, no rigidity or guarding.  No masses palpated. No hepatosplenomegaly. Bowel sounds positive.  Musculoskeletal: no clubbing / cyanosis. No joint deformity upper and lower extremities. Good ROM, no contractures. Normal muscle tone.  Skin: no rashes, lesions, ulcers. No induration Neurologic: CN 2-12 grossly intact. Sensation intact, DTR normal. Strength 5/5 in all 4.  Psychiatric: Normal judgment and insight. Alert and oriented x 3. Normal mood.     Labs on Admission: I have personally reviewed following labs and imaging studies  CBC: Recent Labs  Lab 09/24/21 1304  WBC 9.7  HGB 14.5   HCT 43.7  MCV 95.8  PLT 241   Basic Metabolic Panel: Recent Labs  Lab 09/24/21 1304  NA 139  K 4.2  CL 102  CO2 26  GLUCOSE 123*  BUN 18  CREATININE 1.43*  CALCIUM 9.8   GFR: Estimated Creatinine Clearance: 26.6 mL/min (A) (by C-G formula based on SCr of 1.43 mg/dL (H)). Liver Function Tests: Recent Labs  Lab 09/24/21 1304  AST 25  ALT 15  ALKPHOS 65  BILITOT 1.1  PROT 7.7  ALBUMIN 4.3   Recent Labs  Lab 09/24/21 1304  LIPASE 44   No results for input(s): "AMMONIA" in the last 168 hours. Coagulation Profile: No results for input(s): "INR", "PROTIME" in the last 168 hours. Cardiac Enzymes: No results for input(s): "CKTOTAL", "CKMB", "CKMBINDEX", "TROPONINI" in the last 168 hours.  BNP (last 3 results) No results for input(s): "PROBNP" in the last 8760 hours. HbA1C: No results for input(s): "HGBA1C" in the last 72 hours. CBG: Recent Labs  Lab 09/24/21 1748  GLUCAP 87   Lipid Profile: No results for input(s): "CHOL", "HDL", "LDLCALC", "TRIG", "CHOLHDL", "LDLDIRECT" in the last 72 hours. Thyroid Function Tests: No results for input(s): "TSH", "T4TOTAL", "FREET4", "T3FREE", "THYROIDAB" in the last 72 hours. Anemia Panel: No results for input(s): "VITAMINB12", "FOLATE", "FERRITIN", "TIBC", "IRON", "RETICCTPCT" in the last 72 hours. Urine analysis:    Component Value Date/Time   COLORURINE STRAW (A) 09/24/2021 1511   APPEARANCEUR CLEAR 09/24/2021 1511   LABSPEC 1.013 09/24/2021 1511   PHURINE 7.0 09/24/2021 1511   GLUCOSEU NEGATIVE 09/24/2021 1511   HGBUR NEGATIVE 09/24/2021 1511   BILIRUBINUR NEGATIVE 09/24/2021 1511   KETONESUR NEGATIVE 09/24/2021 1511   PROTEINUR NEGATIVE 09/24/2021 1511   UROBILINOGEN 0.2 06/30/2010 2356   NITRITE NEGATIVE 09/24/2021 1511   LEUKOCYTESUR MODERATE (A) 09/24/2021 1511    Radiological Exams on Admission: CT ABDOMEN PELVIS W CONTRAST  Result Date: 09/24/2021 CLINICAL DATA:  Acute abdominal pain and nausea beginning  last night. Dizziness. EXAM: CT ABDOMEN AND PELVIS WITH CONTRAST TECHNIQUE: Multidetector CT imaging of the abdomen and pelvis was performed using the standard protocol following bolus administration of intravenous contrast. RADIATION DOSE REDUCTION: This exam was performed according to the departmental dose-optimization program which includes automated exposure control, adjustment of the mA and/or kV according to patient size and/or use of iterative reconstruction technique. CONTRAST:  80mL OMNIPAQUE IOHEXOL 300 MG/ML  SOLN COMPARISON:  Noncontrast CT on 06/30/2010 FINDINGS: Lower Chest: No acute findings. Hepatobiliary: No hepatic masses identified. Gallbladder is unremarkable. No evidence of biliary ductal dilatation. Pancreas:  No mass or inflammatory changes. Spleen: Within normal limits in size and appearance. Adrenals/Urinary Tract: No masses identified. 7 cm benign left renal cyst again noted (no followup imaging recommended) no evidence of ureteral calculi or hydronephrosis. Stomach/Bowel: Tiny hiatal hernia is seen. Diffuse colonic diverticulosis is demonstrated, with moderate diverticulitis involving the proximal sigmoid colon. A small diverticular abscess is seen measuring 1.9 cm. Tiny amount of free fluid also noted within the pelvis. Vascular/Lymphatic: No pathologically enlarged lymph nodes. No acute vascular findings. Aortic atherosclerotic calcification incidentally noted. Reproductive: 2.2 cm calcified fibroid again seen in the left posterior uterine wall. No adnexal mass identified. Other:  None. Musculoskeletal:  No suspicious bone lesions identified. IMPRESSION: Moderate sigmoid diverticulitis, with 2 cm diverticular abscess and tiny amount of free fluid. 2 cm calcified uterine fibroid. Tiny hiatal hernia. Aortic Atherosclerosis (ICD10-I70.0). Electronically Signed   By: Danae Orleans M.D.   On: 09/24/2021 15:00   DG Chest Portable 1 View  Result Date: 09/24/2021 CLINICAL DATA:  Fatigue.  EXAM: PORTABLE CHEST 1 VIEW COMPARISON:  None Available. FINDINGS: Cardiac enlargement without heart failure. Atherosclerotic calcification aortic arch. Right lung clear Mild left lower lobe airspace disease likely atelectasis. IMPRESSION: Cardiac enlargement without heart failure Mild left lower lobe atelectasis/infiltrate. Electronically Signed   By: Marlan Palau M.D.   On: 09/24/2021 14:40    EKG: Independently reviewed.  Left bundle branch block and PVCs, comparable to previous EKG.  Assessment/Plan Principal Problem:   Perforated diverticulum of intestine Active Problems:   CAD (coronary artery disease)   DM2 (diabetes mellitus, type 2) (HCC)   HTN (hypertension)   Hypothyroidism   Dyslipidemia   LBBB (left bundle branch block)   CKD (chronic kidney disease), stage IV (HCC)     1.  Acute sigmoid diverticulitis with perforation, contained abscess: Currently fairly stable.  Likely too small to drain by interventional radiology, will await surgical evaluation. N.p.o. except sips of water and ice chips.  IV fluids.  Adequate analgesics for pain relief. Continue Rocephin and Flagyl. Serial abdominal exam. Surgery will follow, likely conservative management.  2.  Coronary artery disease: Currently without chest pain.  EKG with chronic findings.  Troponins 16-19. We will continue aspirin, likely patient will not need any surgical intervention. Hold statin until able to take by mouth. Monitor on telemetry.  3.  Type 2 diabetes: Patient on oral hypoglycemics at home.  Well-controlled.  We will keep on sliding scale insulin while in the hospital.  4.  CKD stage IV: Creatinine 1.43 at about baseline.  Continue IV hydration.  5.  Hypothyroidism: Clinically euthyroid.  Continue Synthroid.  6.  Essential hypertension: Blood pressures fairly stable.  No evidence of orthostatic drop in blood pressure.  Hold diuretics but will continue hydralazine.    DVT prophylaxis: Heparin  subcu Code Status: Full code Family Communication: Husband at bedside Disposition Plan: Home once stable Consults called: General surgery called by ER Admission status: Inpatient, MedSurg bed with remote telemetry.  I anticipate that patient will need IV antibiotics, serial abdominal exams and likely hospitalization for 48 to 72 hours.  Will need inpatient admission.   Dorcas Carrow MD [facility] Pager [phone]

## 2021-09-25 DIAGNOSIS — K5780 Diverticulitis of intestine, part unspecified, with perforation and abscess without bleeding: Secondary | ICD-10-CM | POA: Diagnosis not present

## 2021-09-25 LAB — CBC
HCT: 36.7 % (ref 36.0–46.0)
Hemoglobin: 11.9 g/dL — ABNORMAL LOW (ref 12.0–15.0)
MCH: 31.2 pg (ref 26.0–34.0)
MCHC: 32.4 g/dL (ref 30.0–36.0)
MCV: 96.3 fL (ref 80.0–100.0)
Platelets: 197 10*3/uL (ref 150–400)
RBC: 3.81 MIL/uL — ABNORMAL LOW (ref 3.87–5.11)
RDW: 13.8 % (ref 11.5–15.5)
WBC: 7 10*3/uL (ref 4.0–10.5)
nRBC: 0 % (ref 0.0–0.2)

## 2021-09-25 LAB — GLUCOSE, CAPILLARY
Glucose-Capillary: 79 mg/dL (ref 70–99)
Glucose-Capillary: 79 mg/dL (ref 70–99)
Glucose-Capillary: 80 mg/dL (ref 70–99)
Glucose-Capillary: 89 mg/dL (ref 70–99)
Glucose-Capillary: 97 mg/dL (ref 70–99)

## 2021-09-25 LAB — BASIC METABOLIC PANEL
Anion gap: 11 (ref 5–15)
BUN: 17 mg/dL (ref 8–23)
CO2: 23 mmol/L (ref 22–32)
Calcium: 8.2 mg/dL — ABNORMAL LOW (ref 8.9–10.3)
Chloride: 106 mmol/L (ref 98–111)
Creatinine, Ser: 1.34 mg/dL — ABNORMAL HIGH (ref 0.44–1.00)
GFR, Estimated: 38 mL/min — ABNORMAL LOW (ref >60)
Glucose, Bld: 67 mg/dL — ABNORMAL LOW (ref 70–99)
Potassium: 3.2 mmol/L — ABNORMAL LOW (ref 3.5–5.1)
Sodium: 140 mmol/L (ref 135–145)

## 2021-09-25 LAB — URINE CULTURE

## 2021-09-25 LAB — MAGNESIUM: Magnesium: 2 mg/dL (ref 1.7–2.4)

## 2021-09-25 MED ORDER — POTASSIUM CHLORIDE CRYS ER 20 MEQ PO TBCR
40.0000 meq | EXTENDED_RELEASE_TABLET | Freq: Two times a day (BID) | ORAL | Status: AC
  Administered 2021-09-25 (×2): 40 meq via ORAL
  Filled 2021-09-25 (×2): qty 2

## 2021-09-25 NOTE — Progress Notes (Signed)
  Transition of Care [facility]) Screening Note   Patient Details  Name: [name] Date of Birth: 04/09/1931   [facility]) CM/SW Contact:    Amada Jupiter, LCSW Phone Number: 09/25/2021, 10:21 AM    Transition of Care Department [facility]) has reviewed patient and no TOC needs have been identified at this time. We will continue to monitor patient advancement through interdisciplinary progression rounds. If new patient transition needs arise, please place a TOC consult.

## 2021-09-25 NOTE — Progress Notes (Signed)
PROGRESS NOTE    [name]  WUJ:[id number] DOB: 29-Mar-1932 DOA: 09/24/2021 PCP: Mosetta Putt, MD    Brief Narrative:  88 year old with multiple medical problems admitted with 2 weeks of not feeling well, 1 day of suprapubic pain and found to have uncomplicated perforated diverticulitis.  Admitted with IV antibiotics.   Assessment & Plan:   Acute sigmoid diverticulitis with perforation, contained abscess: Fairly stable.  Abscess not amenable to percutaneous drainage.  Followed by surgery. Continue IV antibiotics with Rocephin and Flagyl.  With clinical stable condition, will advance diet.  Adequate pain medications.  Coronary artery disease: Currently without chest pain.  EKG with chronic findings.  Troponins 16-19. We will continue aspirin, likely patient will not need any surgical intervention. Resume statin on discharge.   Type 2 diabetes: Patient on oral hypoglycemics at home.  Well-controlled.  We will keep on sliding scale insulin while in the hospital.   CKD stage IV: Creatinine 1.43 at about baseline.  Continue IV hydration.   Hypothyroidism: Clinically euthyroid.  Continue Synthroid.   Essential hypertension: Blood pressures fairly stable.  No evidence of orthostatic drop in blood pressure.  Hold diuretics but will continue hydralazine.  Hypokalemia: Replace with potassium oral.  Advance diet.  Discontinue telemetry.  Mobilize in the hallway.  Anticipate home tomorrow.  DVT prophylaxis: heparin injection 5,000 Units Start: 09/24/21 2200   Code Status: Full code Family Communication: None. Disposition Plan: Status is: Inpatient Remains inpatient appropriate because: IV antibiotics     Consultants:  General surgery  Procedures:  None  Antimicrobials:  Rocephin and Flagyl 6/25----   Subjective: Patient seen and examined.  Denies any complaints.  Denies any nausea vomiting.  Passing flatus but no bowel movement yet.  Objective: Vitals:    09/24/21 1848 09/24/21 1925 09/25/21 0041 09/25/21 0624  BP: (!) 136/46 (!) 133/58 (!) 122/53 (!) 116/52  Pulse: (!) 59 (!) 57 (!) 59 62  Resp: 18 16  16   Temp: 98.9 F (37.2 C) 98.5 F (36.9 C) 99.4 F (37.4 C) 98.8 F (37.1 C)  TempSrc: Oral Oral Oral Oral  SpO2: 94% 96% 97% 97%  Weight:      Height:        Intake/Output Summary (Last 24 hours) at 09/25/2021 0725 Last data filed at 09/25/2021 9562 Gross per 24 hour  Intake 780.12 ml  Output 0 ml  Net 780.12 ml   Filed Weights   09/24/21 1248  Weight: 70.3 kg    Examination:  General: Looks comfortable.  Age-appropriate. Cardiovascular: S1-S2 normal.  Regular rate rhythm. Respiratory: Bilateral clear.  No added sounds. Gastrointestinal: Soft.  Nontender.  Bowel sound present. Ext: No edema or swelling. Neuro: Intact. Musculoskeletal: No deformities.      Data Reviewed: I have personally reviewed following labs and imaging studies  CBC: Recent Labs  Lab 09/24/21 1304 09/25/21 0434  WBC 9.7 7.0  HGB 14.5 11.9*  HCT 43.7 36.7  MCV 95.8 96.3  PLT 241 197   Basic Metabolic Panel: Recent Labs  Lab 09/24/21 1304 09/25/21 0434  NA 139 140  K 4.2 3.2*  CL 102 106  CO2 26 23  GLUCOSE 123* 67*  BUN 18 17  CREATININE 1.43* 1.34*  CALCIUM 9.8 8.2*  MG  --  2.0   GFR: Estimated Creatinine Clearance: 28.4 mL/min (A) (by C-G formula based on SCr of 1.34 mg/dL (H)). Liver Function Tests: Recent Labs  Lab 09/24/21 1304  AST 25  ALT 15  ALKPHOS 65  BILITOT 1.1  PROT 7.7  ALBUMIN 4.3   Recent Labs  Lab 09/24/21 1304  LIPASE 44   No results for input(s): "AMMONIA" in the last 168 hours. Coagulation Profile: No results for input(s): "INR", "PROTIME" in the last 168 hours. Cardiac Enzymes: No results for input(s): "CKTOTAL", "CKMB", "CKMBINDEX", "TROPONINI" in the last 168 hours. BNP (last 3 results) No results for input(s): "PROBNP" in the last 8760 hours. HbA1C: No results for input(s):  "HGBA1C" in the last 72 hours. CBG: Recent Labs  Lab 09/24/21 1748 09/25/21 0041 09/25/21 0425 09/25/21 0625 09/25/21 0716  GLUCAP 87 97 79 80 79   Lipid Profile: No results for input(s): "CHOL", "HDL", "LDLCALC", "TRIG", "CHOLHDL", "LDLDIRECT" in the last 72 hours. Thyroid Function Tests: No results for input(s): "TSH", "T4TOTAL", "FREET4", "T3FREE", "THYROIDAB" in the last 72 hours. Anemia Panel: No results for input(s): "VITAMINB12", "FOLATE", "FERRITIN", "TIBC", "IRON", "RETICCTPCT" in the last 72 hours. Sepsis Labs: No results for input(s): "PROCALCITON", "LATICACIDVEN" in the last 168 hours.  No results found for this or any previous visit (from the past 240 hour(s)).       Radiology Studies: CT ABDOMEN PELVIS W CONTRAST  Result Date: 09/24/2021 CLINICAL DATA:  Acute abdominal pain and nausea beginning last night. Dizziness. EXAM: CT ABDOMEN AND PELVIS WITH CONTRAST TECHNIQUE: Multidetector CT imaging of the abdomen and pelvis was performed using the standard protocol following bolus administration of intravenous contrast. RADIATION DOSE REDUCTION: This exam was performed according to the departmental dose-optimization program which includes automated exposure control, adjustment of the mA and/or kV according to patient size and/or use of iterative reconstruction technique. CONTRAST:  80mL OMNIPAQUE IOHEXOL 300 MG/ML  SOLN COMPARISON:  Noncontrast CT on 06/30/2010 FINDINGS: Lower Chest: No acute findings. Hepatobiliary: No hepatic masses identified. Gallbladder is unremarkable. No evidence of biliary ductal dilatation. Pancreas:  No mass or inflammatory changes. Spleen: Within normal limits in size and appearance. Adrenals/Urinary Tract: No masses identified. 7 cm benign left renal cyst again noted (no followup imaging recommended) no evidence of ureteral calculi or hydronephrosis. Stomach/Bowel: Tiny hiatal hernia is seen. Diffuse colonic diverticulosis is demonstrated, with  moderate diverticulitis involving the proximal sigmoid colon. A small diverticular abscess is seen measuring 1.9 cm. Tiny amount of free fluid also noted within the pelvis. Vascular/Lymphatic: No pathologically enlarged lymph nodes. No acute vascular findings. Aortic atherosclerotic calcification incidentally noted. Reproductive: 2.2 cm calcified fibroid again seen in the left posterior uterine wall. No adnexal mass identified. Other:  None. Musculoskeletal:  No suspicious bone lesions identified. IMPRESSION: Moderate sigmoid diverticulitis, with 2 cm diverticular abscess and tiny amount of free fluid. 2 cm calcified uterine fibroid. Tiny hiatal hernia. Aortic Atherosclerosis (ICD10-I70.0). Electronically Signed   By: Danae Orleans M.D.   On: 09/24/2021 15:00   DG Chest Portable 1 View  Result Date: 09/24/2021 CLINICAL DATA:  Fatigue. EXAM: PORTABLE CHEST 1 VIEW COMPARISON:  None Available. FINDINGS: Cardiac enlargement without heart failure. Atherosclerotic calcification aortic arch. Right lung clear Mild left lower lobe airspace disease likely atelectasis. IMPRESSION: Cardiac enlargement without heart failure Mild left lower lobe atelectasis/infiltrate. Electronically Signed   By: Marlan Palau M.D.   On: 09/24/2021 14:40        Scheduled Meds:  aspirin EC  81 mg Oral Daily   heparin  5,000 Units Subcutaneous Q8H   hydrALAZINE  100 mg Oral BID   insulin aspart  0-9 Units Subcutaneous Q6H   levothyroxine  50 mcg Oral Daily   potassium chloride  40  mEq Oral BID   Continuous Infusions:  sodium chloride 75 mL/hr at 09/25/21 0627   cefTRIAXone (ROCEPHIN)  IV     metronidazole 500 mg (09/25/21 0420)     LOS: 1 day    Time spent: 35 minutes    Dorcas Carrow, MD [facility] Pager [phone]

## 2021-09-26 DIAGNOSIS — K5780 Diverticulitis of intestine, part unspecified, with perforation and abscess without bleeding: Secondary | ICD-10-CM | POA: Diagnosis not present

## 2021-09-26 LAB — GLUCOSE, CAPILLARY
Glucose-Capillary: 102 mg/dL — ABNORMAL HIGH (ref 70–99)
Glucose-Capillary: 92 mg/dL (ref 70–99)

## 2021-09-26 MED ORDER — CEPHALEXIN 500 MG PO CAPS: 500.0000 mg | ORAL_CAPSULE | Freq: Three times a day (TID) | ORAL | 0 refills | Status: AC

## 2021-09-26 MED ORDER — METRONIDAZOLE 500 MG PO TABS: 500.0000 mg | ORAL_TABLET | Freq: Three times a day (TID) | ORAL | 0 refills | Status: AC

## 2021-09-26 MED ORDER — SODIUM CHLORIDE 0.9 % IV SOLN
2.0000 g | Freq: Once | INTRAVENOUS | Status: AC
  Administered 2021-09-26: 2 g via INTRAVENOUS
  Filled 2021-09-26: qty 20

## 2021-09-26 NOTE — Progress Notes (Signed)
Discharge instructions given to patient and all questions were answered.  

## 2021-10-06 ENCOUNTER — Other Ambulatory Visit: Payer: Self-pay | Admitting: Family Medicine

## 2021-10-06 DIAGNOSIS — K5720 Diverticulitis of large intestine with perforation and abscess without bleeding: Secondary | ICD-10-CM

## 2021-10-19 ENCOUNTER — Ambulatory Visit
Admission: RE | Admit: 2021-10-19 | Discharge: 2021-10-19 | Disposition: A | Discharge status: Home / Self Care | Payer: Medicare Other | Source: Ambulatory Visit | Attending: Family Medicine | Admitting: Family Medicine

## 2021-10-19 DIAGNOSIS — K5720 Diverticulitis of large intestine with perforation and abscess without bleeding: Secondary | ICD-10-CM

## 2021-10-19 MED ORDER — IOPAMIDOL (ISOVUE-300) INJECTION 61%
100.0000 mL | Freq: Once | INTRAVENOUS | Status: AC | PRN
  Administered 2021-10-19: 80 mL via INTRAVENOUS

## 2022-10-24 ENCOUNTER — Other Ambulatory Visit (HOSPITAL_COMMUNITY): Payer: Self-pay | Admitting: Family Medicine

## 2022-10-24 ENCOUNTER — Ambulatory Visit (HOSPITAL_COMMUNITY): Payer: Medicare Other | Attending: Cardiovascular Disease

## 2022-10-24 DIAGNOSIS — I358 Other nonrheumatic aortic valve disorders: Secondary | ICD-10-CM

## 2022-10-24 LAB — ECHOCARDIOGRAM COMPLETE
Area-P 1/2: 2.5 cm2
S' Lateral: 3.5 cm

## 2024-02-12 ENCOUNTER — Ambulatory Visit (INDEPENDENT_AMBULATORY_CARE_PROVIDER_SITE_OTHER): Admitting: Sports Medicine

## 2024-02-12 ENCOUNTER — Encounter: Payer: Self-pay | Admitting: Sports Medicine

## 2024-02-12 VITALS — BP 138/70 | HR 55 | Temp 98.0°F | Ht 59.0 in | Wt 143.0 lb

## 2024-02-12 DIAGNOSIS — I10 Essential (primary) hypertension: Secondary | ICD-10-CM | POA: Diagnosis not present

## 2024-02-12 DIAGNOSIS — R5383 Other fatigue: Secondary | ICD-10-CM

## 2024-02-12 DIAGNOSIS — E1122 Type 2 diabetes mellitus with diabetic chronic kidney disease: Secondary | ICD-10-CM

## 2024-02-12 DIAGNOSIS — E039 Hypothyroidism, unspecified: Secondary | ICD-10-CM | POA: Diagnosis not present

## 2024-02-12 DIAGNOSIS — E785 Hyperlipidemia, unspecified: Secondary | ICD-10-CM

## 2024-02-12 DIAGNOSIS — N1832 Chronic kidney disease, stage 3b: Secondary | ICD-10-CM

## 2024-02-12 DIAGNOSIS — E538 Deficiency of other specified B group vitamins: Secondary | ICD-10-CM | POA: Diagnosis not present

## 2024-02-12 DIAGNOSIS — Z7984 Long term (current) use of oral hypoglycemic drugs: Secondary | ICD-10-CM

## 2024-02-12 DIAGNOSIS — R051 Acute cough: Secondary | ICD-10-CM

## 2024-02-12 DIAGNOSIS — I2510 Atherosclerotic heart disease of native coronary artery without angina pectoris: Secondary | ICD-10-CM | POA: Diagnosis not present

## 2024-02-12 DIAGNOSIS — E559 Vitamin D deficiency, unspecified: Secondary | ICD-10-CM

## 2024-02-12 LAB — POCT GLYCOSYLATED HEMOGLOBIN (HGB A1C)
HbA1c POC (<> result, manual entry): 6.2 % (ref 4.0–5.6)
HbA1c, POC (controlled diabetic range): 6.2 % (ref 0.0–7.0)
HbA1c, POC (prediabetic range): 6.2 % (ref 5.7–6.4)
Hemoglobin A1C: 6.2 % — AB (ref 4.0–5.6)

## 2024-02-12 MED ORDER — LORATADINE 10 MG PO TABS: 10.0000 mg | ORAL_TABLET | Freq: Every day | ORAL | 0 refills | Status: DC

## 2024-02-12 MED ORDER — GUAIFENESIN-DM 100-10 MG/5ML PO SYRP: 5.0000 mL | ORAL_SOLUTION | Freq: Four times a day (QID) | ORAL | 0 refills | Status: DC | PRN

## 2024-02-12 MED ORDER — HYDROCHLOROTHIAZIDE 25 MG PO TABS: 25.0000 mg | ORAL_TABLET | Freq: Every day | ORAL | 3 refills | Status: AC

## 2024-02-12 MED ORDER — LISINOPRIL 5 MG PO TABS: 5.0000 mg | ORAL_TABLET | Freq: Every day | ORAL | 3 refills | Status: AC

## 2024-02-12 NOTE — Progress Notes (Addendum)
 New Patient Office Visit  Subjective    Patient ID: [name], female    DOB: [birth year]  Age: 89 y.o. MRN: [id number]  CC:  Chief Complaint  Patient presents with  . Establish Care    New pt est care. Pt states she has had a cough that's worse at night for about 3 weeks now. Sometimes she is able to cough up white phlegm.     HPI  [name] is a 89 year old female with diabetes and hypertension who presents to establish care c/o worsening respiratory symptoms following a flu shot.  Three weeks ago, she received a flu shot while having a cold. Since then, she has experienced worsening respiratory symptoms, including nocturnal cough with white sputum, which disrupts her sleep. She describes the sensation as originating from her chest or head and notes that it has been the worst she has ever felt. However, she reports improvement in her symptoms today compared to yesterday. No runny nose, sore throat, or phlegm with her cough, and her breathing is described as 'pretty good'. She reports post nasal drip lying down but denies sinus or ear pain.  Her appetite has been poor over the last three weeks, and she only consumed hash browns today. She feels fatigued and tired, which she attributes to her recent illness. No chest pain, palpitations, leg swelling, heartburn, acid reflux, urinary issues, or recent falls.   She drives and manages household tasks independently. She reports occasional depression and lack of interest in activities, but not consistently.  Her current medications include levothyroxine  for thyroid, a cholesterol medication, baby aspirin , and Januvia for diabetes. She also takes triamterene  and hydrochlorothiazide, and amlodipine  for hypertension. She denies any known allergies. She reports her last eye exam was recent, and she is due for a follow-up. She denies tingling or numbness in her legs, but occasionally experiences vision problems, which she  attributes to old glasses.  Outpatient Encounter Medications as of 02/12/2024  Medication Sig  . atorvastatin (LIPITOR) 40 MG tablet Take 40 mg by mouth daily.    . betamethasone dipropionate 0.05 % lotion Apply 1 Application topically daily as needed for rash.  . bismuth  subsalicylate (PEPTO BISMOL) 262 MG/15ML suspension Take 30 mLs by mouth every 6 (six) hours as needed for indigestion.  . Cholecalciferol (VITAMIN D3) 2000 UNITS TABS Take 2,000 Int'l Units by mouth daily.    SABRA guaiFENesin-dextromethorphan (ROBITUSSIN DM) 100-10 MG/5ML syrup Take 5 mLs by mouth every 6 (six) hours as needed for cough.  . hydrochlorothiazide (HYDRODIURIL) 25 MG tablet Take 1 tablet (25 mg total) by mouth daily.  . levothyroxine  (SYNTHROID , LEVOTHROID) 50 MCG tablet Take 50 mcg by mouth daily.    SABRA lisinopril (ZESTRIL) 5 MG tablet Take 1 tablet (5 mg total) by mouth daily.  SABRA loratadine (CLARITIN) 10 MG tablet Take 1 tablet (10 mg total) by mouth daily.  . sitaGLIPtin (JANUVIA) 50 MG tablet Take 50 mg by mouth daily.  . [DISCONTINUED] hydrALAZINE  (APRESOLINE ) 100 MG tablet Take 1 tablet (100 mg total) by mouth 2 (two) times daily.  . aspirin  81 MG tablet Take 81 mg by mouth daily.    . [DISCONTINUED] triamterene -hydrochlorothiazide (DYAZIDE) 37.5-25 MG capsule Take 2 each (2 capsules total) by mouth daily. (Patient not taking: Reported on 02/12/2024)   No facility-administered encounter medications on file as of 02/12/2024.    Past Medical History:  Diagnosis Date  . CAD (coronary artery disease)    a.  s/p NSTEMI 06/30/10 tx with BMS to LAD;  b. cath 4/12: mLAD 80% (PCI), RCA 20%; EF 65-70%;  c. echo 1/11: EF 65%, mild MR  . Diverticulosis   . DM2 (diabetes mellitus, type 2) (HCC)   . Dyslipidemia    Crestor  started 4/12  . HTN (hypertension)   . Hypothyroidism   . LBBB (left bundle branch block)   . Myocardial infarction [facility])     Past Surgical History:  Procedure Laterality Date  . ABDOMINAL  HYSTERECTOMY    . THYROIDECTOMY    . TUBAL LIGATION      Family History  Problem Relation Age of Onset  . Breast cancer Sister   . Stroke Mother   . Hypertension Mother   . Heart attack Father   . Lung cancer Sister   . Colon cancer Neg Hx     Social History   Socioeconomic History  . Marital status: Married    Spouse name: Not on file  . Number of children: 3  . Years of education: Not on file  . Highest education level: Not on file  Occupational History  . Occupation: Retired  Tobacco Use  . Smoking status: Never  . Smokeless tobacco: Never  Substance and Sexual Activity  . Alcohol use: No  . Drug use: No  . Sexual activity: Not on file  Other Topics Concern  . Not on file  Social History Narrative   Caffeine daily    Social Drivers of Health   Financial Resource Strain: Not on file  Food Insecurity: Not on file  Transportation Needs: Not on file  Physical Activity: Not on file  Stress: Not on file  Social Connections: Not on file  Intimate Partner Violence: Not on file    Review of Systems  Constitutional:  Positive for malaise/fatigue. Negative for fever.  HENT:  Negative for congestion, ear pain and sore throat.   Respiratory:  Positive for cough and sputum production. Negative for shortness of breath.   Cardiovascular:  Negative for chest pain, palpitations and leg swelling.  Gastrointestinal:  Negative for heartburn and nausea.  Genitourinary:  Negative for dysuria and hematuria.        Objective    BP 138/70   Pulse (!) 55   Temp 98 F (36.7 C) (Oral)   Ht 4' 11 (1.499 m)   Wt 143 lb (64.9 kg)   SpO2 98%   BMI 28.88 kg/m   Physical Exam Constitutional:      Appearance: Normal appearance.  HENT:     Head: Normocephalic and atraumatic.  Cardiovascular:     Rate and Rhythm: Normal rate and regular rhythm.  Pulmonary:     Effort: Pulmonary effort is normal. No respiratory distress.     Breath sounds: Normal breath sounds. No  wheezing.  Abdominal:     General: Bowel sounds are normal. There is no distension.     Tenderness: There is no abdominal tenderness. There is no guarding or rebound.     Comments:    Musculoskeletal:        General: No swelling or tenderness.  Neurological:     Mental Status: She is alert. Mental status is at baseline.     Sensory: No sensory deficit.     Motor: No weakness.         Assessment & Plan:   Problem List Items Addressed This Visit       Cardiovascular and Mediastinum   CAD (coronary artery disease)  Relevant Medications   lisinopril (ZESTRIL) 5 MG tablet   hydrochlorothiazide (HYDRODIURIL) 25 MG tablet   HTN (hypertension) - Primary   Relevant Medications   lisinopril (ZESTRIL) 5 MG tablet   hydrochlorothiazide (HYDRODIURIL) 25 MG tablet     Endocrine   DM2 (diabetes mellitus, type 2) (HCC)   Relevant Medications   lisinopril (ZESTRIL) 5 MG tablet   Other Relevant Orders   Ambulatory referral to Ophthalmology   POCT glycosylated hemoglobin (Hb A1C) (Completed)   Hypothyroidism     Other   Dyslipidemia   Fatigue   Relevant Orders   CBC (Completed)   Basic Metabolic Panel (BMET) (Completed)   TSH (Completed)   Other Visit Diagnoses       Vitamin D deficiency       Relevant Orders   VITAMIN D 25 Hydroxy (Vit-D Deficiency, Fractures) (Completed)     B12 deficiency       Relevant Orders   B12 (Completed)     Acute cough       Relevant Medications   guaiFENesin-dextromethorphan (ROBITUSSIN DM) 100-10 MG/5ML syrup     CKD stage 3b, GFR 30-44 ml/min (HCC)          1. Primary hypertension (Primary) Will stop amlodipine  Will start lisinopril  Cont with hydrochlorothiazide Follow up in 4 weeks  - hydrochlorothiazide (HYDRODIURIL) 25 MG tablet; Take 1 tablet (25 mg total) by mouth daily.  Dispense: 90 tablet; Refill: 3  2. Coronary artery disease involving native coronary artery of native heart without angina pectoris Stable Cont with  aspirin , lipitor  3. Type 2 diabetes mellitus with stage 3 chronic kidney disease, without long-term current use of insulin , unspecified whether stage 3a or 3b CKD (HCC) A1c 6.2 - Hemoglobin A1c - Ambulatory referral to Ophthalmology - lisinopril (ZESTRIL) 5 MG tablet; Take 1 tablet (5 mg total) by mouth daily.  Dispense: 90 tablet; Refill: 3  4. Acquired hypothyroidism Will check TSH and adjust dose accordingly  5. CKD (chronic kidney disease), stage  3b Will check bmp    6. Dyslipidemia Cont with lipitor  7. Other fatigue Wil check labs  Cough  Lungs clear  O2  98% on RA Instructed patient to robitussin DM for cough  Take claritin for post nasal drip  Return in about 4 weeks (around 03/11/2024).  4 weeks   Jackalyn Blazing, MD

## 2024-02-12 NOTE — Patient Instructions (Addendum)
 Stop amlodipine  , trimaterene- hydrochlorothiazide  Start lisinopril 5mg  daily Take hydrochlorothiazide 25mg  daily  Follow up in clinic in 4 weeks   Take claritin daily which helps with post nasal drip  Take robitussin every 6 hrs as needed for cough   If labs were collected or images ordered, we will inform you of the results once we have received and reviewed them. We will contact you either by fpl group, or telephone call. If a referral to a specialist was entered for you, please call us  in 2 weeks if you have not heard from the specialist office to schedule.   Thank you for choosing us  for your care. Wishing you a healthy and peaceful winter season. Stay well and we look forward to seeing you at your next visit.

## 2024-02-13 LAB — TSH: TSH: 1.59 u[IU]/mL (ref 0.35–5.50)

## 2024-02-13 LAB — CBC
HCT: 41 % (ref 36.0–46.0)
Hemoglobin: 14 g/dL (ref 12.0–15.0)
MCHC: 34.1 g/dL (ref 30.0–36.0)
MCV: 93.4 fl (ref 78.0–100.0)
Platelets: 331 K/uL (ref 150.0–400.0)
RBC: 4.39 Mil/uL (ref 3.87–5.11)
RDW: 14.3 % (ref 11.5–15.5)
WBC: 6.4 K/uL (ref 4.0–10.5)

## 2024-02-13 LAB — VITAMIN D 25 HYDROXY (VIT D DEFICIENCY, FRACTURES): Vit D, 25-Hydroxy: 76 ng/mL (ref 30–100)

## 2024-02-13 LAB — BASIC METABOLIC PANEL WITH GFR
BUN: 17 mg/dL (ref 6–23)
CO2: 27 meq/L (ref 19–32)
Calcium: 9.7 mg/dL (ref 8.4–10.5)
Chloride: 100 meq/L (ref 96–112)
Creatinine, Ser: 1.32 mg/dL — ABNORMAL HIGH (ref 0.40–1.20)
GFR: 35.66 mL/min — ABNORMAL LOW (ref >60.00)
Glucose, Bld: 66 mg/dL — ABNORMAL LOW (ref 70–99)
Potassium: 3.8 meq/L (ref 3.5–5.1)
Sodium: 139 meq/L (ref 135–145)

## 2024-02-13 LAB — VITAMIN B12: Vitamin B-12: 272 pg/mL (ref 211–911)

## 2024-02-14 ENCOUNTER — Ambulatory Visit: Payer: Self-pay | Admitting: Sports Medicine

## 2024-02-14 MED ORDER — B-12 500 MCG PO TABS: 500.0000 ug | ORAL_TABLET | Freq: Every day | ORAL | 0 refills | Status: DC

## 2024-02-25 ENCOUNTER — Ambulatory Visit: Payer: Self-pay

## 2024-02-25 NOTE — Telephone Encounter (Signed)
 No further action needed. Pt scheduled by triage

## 2024-02-25 NOTE — Telephone Encounter (Signed)
 FYI Only or Action Required?: FYI only for provider: appointment scheduled on 02/26/24.  Patient was last seen in primary care on 02/12/2024 by Carrie Madden, MD.  Called Nurse Triage reporting Weakness.  Symptoms began several weeks ago.  Interventions attempted: Rest, hydration, or home remedies.  Symptoms are: unchanged.  Triage Disposition: See Physician Within 24 Hours  Patient/caregiver understands and will follow disposition?: Yes         Copied from CRM #[id number]. Topic: Clinical - Red Word Triage >> Feb 25, 2024  3:37 PM [name] wrote: [facility] that prompted transfer to Nurse Triage: The patient took lisinopril  (ZESTRIL ) 5 MG tablets and is now feeling weak and fatigued. Reason for Disposition  [1] MODERATE weakness (e.g., interferes with work, school, normal activities) AND [2] persists > 3 days  Answer Assessment - Initial Assessment Questions 1. DESCRIPTION: Describe how you are feeling.     Weakness/fatigue, cough (esp at night) 2. SEVERITY: How bad is it?  Can you stand and walk?     denies 3. ONSET: When did these symptoms begin? (e.g., hours, days, weeks, months)     2  weeks ago 4. CAUSE: What do you think is causing the weakness or fatigue? (e.g., not drinking enough fluids, medical problem, trouble sleeping)     Medication, reports pt does not drink as much fluid as she should 5. NEW MEDICINES:  Have you started on any new medicines recently? (e.g., opioid pain medicines, benzodiazepines, muscle relaxants, antidepressants, antihistamines, neuroleptics, beta blockers)     lisinopril  (ZESTRIL ) 5 MG 6. OTHER SYMPTOMS: Do you have any other symptoms? (e.g., chest pain, fever, cough, SOB, vomiting, diarrhea, bleeding, other areas of pain)     denies 7. PREGNANCY: Is there any chance you are pregnant? When was your last menstrual period?     N/a  Protocols used: Weakness (Generalized) and Fatigue-A-AH

## 2024-02-26 ENCOUNTER — Ambulatory Visit: Admitting: Family Medicine

## 2024-02-26 ENCOUNTER — Encounter: Payer: Self-pay | Admitting: Family Medicine

## 2024-02-26 DIAGNOSIS — R5383 Other fatigue: Secondary | ICD-10-CM

## 2024-02-26 NOTE — Patient Instructions (Signed)

## 2024-02-26 NOTE — Progress Notes (Signed)
   Same day cancel

## 2024-03-02 ENCOUNTER — Ambulatory Visit (HOSPITAL_BASED_OUTPATIENT_CLINIC_OR_DEPARTMENT_OTHER)
Admission: RE | Admit: 2024-03-02 | Discharge: 2024-03-02 | Disposition: A | Source: Ambulatory Visit | Attending: Sports Medicine

## 2024-03-02 ENCOUNTER — Encounter: Payer: Self-pay | Admitting: Sports Medicine

## 2024-03-02 ENCOUNTER — Ambulatory Visit: Admitting: Sports Medicine

## 2024-03-02 VITALS — BP 165/88 | HR 46 | Temp 98.0°F | Wt 145.8 lb

## 2024-03-02 DIAGNOSIS — I10 Essential (primary) hypertension: Secondary | ICD-10-CM

## 2024-03-02 DIAGNOSIS — E538 Deficiency of other specified B group vitamins: Secondary | ICD-10-CM

## 2024-03-02 DIAGNOSIS — K219 Gastro-esophageal reflux disease without esophagitis: Secondary | ICD-10-CM

## 2024-03-02 DIAGNOSIS — R051 Acute cough: Secondary | ICD-10-CM | POA: Diagnosis present

## 2024-03-02 LAB — POCT INFLUENZA A/B
Influenza A, POC: NEGATIVE
Influenza B, POC: NEGATIVE

## 2024-03-02 LAB — POC COVID19 BINAXNOW: SARS Coronavirus 2 Ag: NEGATIVE

## 2024-03-02 MED ORDER — BENZONATATE 200 MG PO CAPS: 200.0000 mg | ORAL_CAPSULE | Freq: Two times a day (BID) | ORAL | 0 refills | Status: AC | PRN

## 2024-03-02 MED ORDER — PANTOPRAZOLE SODIUM 40 MG PO TBEC: 40.0000 mg | DELAYED_RELEASE_TABLET | Freq: Every day | ORAL | 3 refills | Status: AC

## 2024-03-02 NOTE — Assessment & Plan Note (Addendum)
 High today Instructed patient to resume hydrochlorothiazide  Follow up in clinic on 12/10, will check bmp then

## 2024-03-02 NOTE — Progress Notes (Signed)
 Careteam: Patient Care Team: Sherlynn Madden, MD as PCP - General (Internal Medicine) Rolan Ezra RAMAN, MD as PCP - Advanced Heart Failure (Cardiology)  No Known Allergies  Chief Complaint  Patient presents with   Fatigue    Pt has been having issues with fatigue.     Discussed the use of AI scribe software for clinical note transcription with the patient, who gave verbal consent to proceed.  History of Present Illness    [name] is a 89 year old female who presents with a persistent cough and leg swelling.    She has been experiencing a persistent cough for about two weeks. The cough is productive with white phlegm and is more pronounced at night. No fever, sore throat, runny nose, or changes in breathing are noted. She occasionally feels postnasal drip when lying down and uses cough drops at night, which provide temporary relief.  She experiences leg swelling during the day, which subsides at night. The swelling occurs when she is on her feet a lot. She has not been taking her prescribed hydrochlorothiazide  for the past three days.  She reports occasional dizziness and nausea. No pain with urination, blood in urine or stool, or body aches. She has a history of low energy levels and a previously poor appetite, which has improved over the past month.  She lives with her husband.  Review of Systems:  Review of Systems  Constitutional:  Positive for malaise/fatigue. Negative for fever.  Respiratory:  Positive for cough and sputum production. Negative for shortness of breath.   Gastrointestinal:  Negative for abdominal pain, constipation, diarrhea, heartburn, nausea and vomiting.  Genitourinary:  Negative for dysuria and hematuria.  Neurological:  Negative for dizziness.   Negative unless indicated in HPI.   Patient Active Problem List   Diagnosis Date Noted   Bradycardia 12/22/2013   Murmur 11/15/2011   Fatigue 09/13/2010   CAD (coronary artery  disease)    DM2 (diabetes mellitus, type 2) (HCC)    HTN (hypertension)    Hypothyroidism    Dyslipidemia    LBBB (left bundle branch block)    Past Medical History:  Diagnosis Date   CAD (coronary artery disease)    a. s/p NSTEMI 06/30/10 tx with BMS to LAD;  b. cath 4/12: mLAD 80% (PCI), RCA 20%; EF 65-70%;  c. echo 1/11: EF 65%, mild MR   Diverticulosis    DM2 (diabetes mellitus, type 2) (HCC)    Dyslipidemia    Crestor  started 4/12   HTN (hypertension)    Hypothyroidism    LBBB (left bundle branch block)    Myocardial infarction (HCC)    Perforated diverticulum of intestine 09/24/2021   Past Surgical History:  Procedure Laterality Date   ABDOMINAL HYSTERECTOMY     THYROIDECTOMY     TUBAL LIGATION     Social History   Tobacco Use   Smoking status: Never   Smokeless tobacco: Never  Substance Use Topics   Alcohol use: No   Drug use: No   Family History  Problem Relation Age of Onset   Breast cancer Sister    Stroke Mother    Hypertension Mother    Heart attack Father    Lung cancer Sister    Colon cancer Neg Hx    No Known Allergies  Medications: Patient's Medications  New Prescriptions   No medications on file  Previous Medications   ASPIRIN  81 MG TABLET    Take 81 mg by mouth  daily.     ATORVASTATIN (LIPITOR) 40 MG TABLET    Take 40 mg by mouth daily.     BETAMETHASONE DIPROPIONATE 0.05 % LOTION    Apply 1 Application topically daily as needed for rash.   CHOLECALCIFEROL (VITAMIN D3) 2000 UNITS TABS    Take 2,000 Int'l Units by mouth daily.     CYANOCOBALAMIN  (B-12) 500 MCG TABS    Take 500 mcg by mouth daily.   GUAIFENESIN -DEXTROMETHORPHAN (ROBITUSSIN DM) 100-10 MG/5ML SYRUP    Take 5 mLs by mouth every 6 (six) hours as needed for cough.   HYDROCHLOROTHIAZIDE  (HYDRODIURIL ) 25 MG TABLET    Take 1 tablet (25 mg total) by mouth daily.   LEVOTHYROXINE  (SYNTHROID , LEVOTHROID) 50 MCG TABLET    Take 50 mcg by mouth daily.     LISINOPRIL  (ZESTRIL ) 5 MG TABLET     Take 1 tablet (5 mg total) by mouth daily.   LORATADINE  (CLARITIN ) 10 MG TABLET    Take 1 tablet (10 mg total) by mouth daily.   SITAGLIPTIN (JANUVIA) 50 MG TABLET    Take 50 mg by mouth daily.  Modified Medications   No medications on file  Discontinued Medications   No medications on file    Physical Exam: Vitals:   03/02/24 1034  TempSrc: Oral  Weight: 145 lb 12.8 oz (66.1 kg)   Body mass index is 29.45 kg/m. BP Readings from Last 3 Encounters:  02/12/24 138/70  09/26/21 (!) 136/54  10/25/20 (!) 152/80   Wt Readings from Last 3 Encounters:  03/02/24 145 lb 12.8 oz (66.1 kg)  02/12/24 143 lb (64.9 kg)  09/26/21 152 lb 1.9 oz (69 kg)    Physical Exam Constitutional:      Appearance: Normal appearance.  HENT:     Head: Normocephalic and atraumatic.     Mouth/Throat:     Pharynx: No oropharyngeal exudate or posterior oropharyngeal erythema.  Eyes:     Pupils: Pupils are equal, round, and reactive to light.  Cardiovascular:     Rate and Rhythm: Normal rate and regular rhythm.  Pulmonary:     Effort: Pulmonary effort is normal. No respiratory distress.     Breath sounds: Normal breath sounds. No wheezing.  Abdominal:     General: Bowel sounds are normal. There is no distension.     Tenderness: There is no abdominal tenderness. There is no guarding or rebound.     Comments:    Musculoskeletal:        General: No swelling.  Neurological:     Mental Status: She is alert. Mental status is at baseline.     Motor: No weakness.     Labs reviewed: Basic Metabolic Panel: Recent Labs    02/12/24 1454  NA 139  K 3.8  CL 100  CO2 27  GLUCOSE 66*  BUN 17  CREATININE 1.32*  CALCIUM  9.7  TSH 1.59   Liver Function Tests: No results for input(s): AST, ALT, ALKPHOS, BILITOT, PROT, ALBUMIN in the last 8760 hours. No results for input(s): LIPASE, AMYLASE in the last 8760 hours. No results for input(s): AMMONIA in the last 8760 hours. CBC: Recent  Labs    02/12/24 1454  WBC 6.4  HGB 14.0  HCT 41.0  MCV 93.4  PLT 331.0   Lipid Panel: No results for input(s): CHOL, HDL, LDLCALC, TRIG, CHOLHDL, LDLDIRECT in the last 8760 hours. TSH: Recent Labs    02/12/24 1454  TSH 1.59   A1C: Lab Results  Component  Value Date   HGBA1C 6.2 (A) 02/12/2024   HGBA1C 6.2 02/12/2024   HGBA1C 6.2 02/12/2024   HGBA1C 6.2 02/12/2024    Assessment & Plan Acute cough Flu, covid neg Will order chest x ray  Will send tessalon to her pharmacy Orders:   benzonatate (TESSALON) 200 MG capsule; Take 1 capsule (200 mg total) by mouth 2 (two) times daily as needed for cough.   DG Chest 2 View; Future  Primary hypertension High today Instructed patient to resume hydrochlorothiazide  Follow up in clinic on 12/10, will check bmp then    B12 deficiency Start b12 supplements    Gastroesophageal reflux disease, unspecified whether esophagitis present Avoid spicy foods Start protonix daily Orders:   pantoprazole (PROTONIX) 40 MG tablet; Take 1 tablet (40 mg total) by mouth daily.   No follow-ups on file.:   Deola Rewis

## 2024-03-02 NOTE — Patient Instructions (Addendum)
 Please go to [facility] for chest x ray   [facility] [address], [address] [place],  [place]  [address] Main: [phone]

## 2024-03-05 ENCOUNTER — Ambulatory Visit: Payer: Self-pay | Admitting: Sports Medicine

## 2024-03-07 ENCOUNTER — Other Ambulatory Visit: Payer: Self-pay | Admitting: Sports Medicine

## 2024-03-11 ENCOUNTER — Encounter: Payer: Self-pay | Admitting: Sports Medicine

## 2024-03-11 ENCOUNTER — Telehealth: Payer: Self-pay

## 2024-03-11 ENCOUNTER — Ambulatory Visit: Admitting: Sports Medicine

## 2024-03-11 VITALS — BP 132/82 | HR 61 | Temp 98.0°F | Wt 140.8 lb

## 2024-03-11 DIAGNOSIS — I10 Essential (primary) hypertension: Secondary | ICD-10-CM | POA: Diagnosis not present

## 2024-03-11 DIAGNOSIS — K219 Gastro-esophageal reflux disease without esophagitis: Secondary | ICD-10-CM

## 2024-03-11 DIAGNOSIS — R051 Acute cough: Secondary | ICD-10-CM | POA: Diagnosis not present

## 2024-03-11 DIAGNOSIS — E538 Deficiency of other specified B group vitamins: Secondary | ICD-10-CM | POA: Diagnosis not present

## 2024-03-11 DIAGNOSIS — N1831 Chronic kidney disease, stage 3a: Secondary | ICD-10-CM | POA: Diagnosis not present

## 2024-03-11 DIAGNOSIS — Z780 Asymptomatic menopausal state: Secondary | ICD-10-CM

## 2024-03-11 DIAGNOSIS — E1122 Type 2 diabetes mellitus with diabetic chronic kidney disease: Secondary | ICD-10-CM | POA: Diagnosis not present

## 2024-03-11 LAB — BASIC METABOLIC PANEL WITH GFR
BUN: 17 mg/dL (ref 6–23)
CO2: 29 meq/L (ref 19–32)
Calcium: 10 mg/dL (ref 8.4–10.5)
Chloride: 103 meq/L (ref 96–112)
Creatinine, Ser: 1.23 mg/dL — ABNORMAL HIGH (ref 0.40–1.20)
GFR: 38.79 mL/min — ABNORMAL LOW (ref >60.00)
Glucose, Bld: 89 mg/dL (ref 70–99)
Potassium: 4 meq/L (ref 3.5–5.1)
Sodium: 142 meq/L (ref 135–145)

## 2024-03-11 MED ORDER — CYANOCOBALAMIN 500 MCG PO TABS: 500.0000 ug | ORAL_TABLET | Freq: Every day | ORAL | 0 refills | Status: AC

## 2024-03-11 NOTE — Patient Instructions (Signed)

## 2024-03-11 NOTE — Assessment & Plan Note (Addendum)
 Bp better today  Cont with hydrochlorothiazide , lisinopril   Will check bmp Will check if her pharmacy can do blister package

## 2024-03-11 NOTE — Assessment & Plan Note (Addendum)
 Lab Results  Component Value Date   HGBA1C 6.2 (A) 02/12/2024   HGBA1C 6.2 02/12/2024   HGBA1C 6.2 02/12/2024   HGBA1C 6.2 02/12/2024   At goal  Foot exam done today  Orders:   Ambulatory referral to Ophthalmology

## 2024-03-11 NOTE — Telephone Encounter (Signed)
 Called pharmacy to request blister pack on pts meds. Pharmacy wasn't sure if they did that but will note pts chart

## 2024-03-11 NOTE — Progress Notes (Signed)
 Careteam: Patient Care Team: Sherlynn Madden, MD as PCP - General (Internal Medicine) Rolan Ezra RAMAN, MD as PCP - Advanced Heart Failure (Cardiology)  No Known Allergies  Chief Complaint  Patient presents with   Follow-up    4 week follow up. Discuss medications. She states her cough is much better.    Discussed the use of AI scribe software for clinical note transcription with the patient, who gave verbal consent to proceed.  History of Present Illness    [name] is a 89 year old female with hypertension who presents for a follow-up visit.  She has been experiencing elevated blood pressure and has started taking hydrochlorothiazide  and lisinopril . No chest pain, shortness of breath, or palpitations.    Latest Ref Rng & Units 02/12/2024    2:54 PM 09/25/2021    4:34 AM 09/24/2021    1:04 PM  BMP  Glucose 70 - 99 mg/dL 66  67  876   BUN 6 - 23 mg/dL 17  17  18    Creatinine 0.40 - 1.20 mg/dL 8.67  8.65  8.56   Sodium 135 - 145 mEq/L 139  140  139   Potassium 3.5 - 5.1 mEq/L 3.8  3.2  4.2   Chloride 96 - 112 mEq/L 100  106  102   CO2 19 - 32 mEq/L 27  23  26    Calcium  8.4 - 10.5 mg/dL 9.7  8.2  9.8      Her cough has significantly improved. No fever or breathing difficulties.  She denies frequent heartburn or acid reflux, although she is taking Protonix  for acid reflux.    No abdominal pain, blood in urine, or changes in bowel movements, confirming regular daily bowel movements.  Review of Systems:  Review of Systems  Constitutional:  Negative for chills and fever.  HENT:  Negative for congestion and sore throat.   Respiratory:  Negative for cough, sputum production and shortness of breath.   Cardiovascular:  Negative for chest pain, palpitations and leg swelling.  Gastrointestinal:  Negative for abdominal pain, heartburn and nausea.  Genitourinary:  Negative for dysuria, frequency and hematuria.  Musculoskeletal:  Negative for falls and  myalgias.  Neurological:  Negative for dizziness, sensory change and focal weakness.   Negative unless indicated in HPI.   Patient Active Problem List   Diagnosis Date Noted   Bradycardia 12/22/2013   Murmur 11/15/2011   Fatigue 09/13/2010   CAD (coronary artery disease)    DM2 (diabetes mellitus, type 2) (HCC)    HTN (hypertension)    Hypothyroidism    Dyslipidemia    LBBB (left bundle branch block)    Past Medical History:  Diagnosis Date   CAD (coronary artery disease)    a. s/p NSTEMI 06/30/10 tx with BMS to LAD;  b. cath 4/12: mLAD 80% (PCI), RCA 20%; EF 65-70%;  c. echo 1/11: EF 65%, mild MR   Diverticulosis    DM2 (diabetes mellitus, type 2) (HCC)    Dyslipidemia    Crestor  started 4/12   HTN (hypertension)    Hypothyroidism    LBBB (left bundle branch block)    Myocardial infarction (HCC)    Perforated diverticulum of intestine 09/24/2021   Past Surgical History:  Procedure Laterality Date   ABDOMINAL HYSTERECTOMY     THYROIDECTOMY     TUBAL LIGATION     Social History   Tobacco Use   Smoking status: Never   Smokeless tobacco: Never  Substance Use  Topics   Alcohol use: No   Drug use: No   Family History  Problem Relation Age of Onset   Breast cancer Sister    Stroke Mother    Hypertension Mother    Heart attack Father    Lung cancer Sister    Colon cancer Neg Hx    No Known Allergies  Medications: Patient's Medications  New Prescriptions   CYANOCOBALAMIN  (VITAMIN B12) 500 MCG TABLET    Take 1 tablet (500 mcg total) by mouth daily.  Previous Medications   ASPIRIN  81 MG TABLET    Take 81 mg by mouth daily.     ATORVASTATIN (LIPITOR) 40 MG TABLET    Take 40 mg by mouth daily.     BENZONATATE  (TESSALON ) 200 MG CAPSULE    Take 1 capsule (200 mg total) by mouth 2 (two) times daily as needed for cough.   BETAMETHASONE DIPROPIONATE 0.05 % LOTION    Apply 1 Application topically daily as needed for rash.   HYDROCHLOROTHIAZIDE  (HYDRODIURIL ) 25 MG TABLET     Take 1 tablet (25 mg total) by mouth daily.   LEVOTHYROXINE  (SYNTHROID , LEVOTHROID) 50 MCG TABLET    Take 50 mcg by mouth daily.     LISINOPRIL  (ZESTRIL ) 5 MG TABLET    Take 1 tablet (5 mg total) by mouth daily.   LORATADINE  (CLARITIN ) 10 MG TABLET    TAKE 1 TABLET BY MOUTH EVERY DAY   PANTOPRAZOLE  (PROTONIX ) 40 MG TABLET    Take 1 tablet (40 mg total) by mouth daily.   SITAGLIPTIN (JANUVIA) 50 MG TABLET    Take 50 mg by mouth daily.  Modified Medications   No medications on file  Discontinued Medications   CYANOCOBALAMIN  (B-12) 500 MCG TABS    Take 500 mcg by mouth daily.    Physical Exam: Vitals:   03/11/24 0805 03/11/24 0834  BP: (!) 159/83 132/82  Pulse: 61   Temp: 98 F (36.7 C)   TempSrc: Oral   SpO2: 100%   Weight: 140 lb 12.8 oz (63.9 kg)    Body mass index is 28.44 kg/m. BP Readings from Last 3 Encounters:  03/11/24 132/82  03/02/24 (!) 165/88  02/12/24 138/70   Wt Readings from Last 3 Encounters:  03/11/24 140 lb 12.8 oz (63.9 kg)  03/02/24 145 lb 12.8 oz (66.1 kg)  02/12/24 143 lb (64.9 kg)    Physical Exam Constitutional:      Appearance: Normal appearance.  HENT:     Head: Normocephalic and atraumatic.  Eyes:     Conjunctiva/sclera: Conjunctivae normal.  Cardiovascular:     Rate and Rhythm: Normal rate and regular rhythm.  Pulmonary:     Effort: Pulmonary effort is normal. No respiratory distress.     Breath sounds: Normal breath sounds. No wheezing.  Abdominal:     General: Bowel sounds are normal. There is no distension.     Tenderness: There is no abdominal tenderness. There is no guarding or rebound.     Comments:    Musculoskeletal:        General: No swelling or tenderness.  Neurological:     Mental Status: She is alert. Mental status is at baseline.     Sensory: No sensory deficit.     Motor: No weakness.     Labs reviewed: Basic Metabolic Panel: Recent Labs    02/12/24 1454  NA 139  K 3.8  CL 100  CO2 27  GLUCOSE 66*  BUN  17  CREATININE  1.32*  CALCIUM  9.7  TSH 1.59   Liver Function Tests: No results for input(s): AST, ALT, ALKPHOS, BILITOT, PROT, ALBUMIN in the last 8760 hours. No results for input(s): LIPASE, AMYLASE in the last 8760 hours. No results for input(s): AMMONIA in the last 8760 hours. CBC: Recent Labs    02/12/24 1454  WBC 6.4  HGB 14.0  HCT 41.0  MCV 93.4  PLT 331.0   Lipid Panel: No results for input(s): CHOL, HDL, LDLCALC, TRIG, CHOLHDL, LDLDIRECT in the last 8760 hours. TSH: Recent Labs    02/12/24 1454  TSH 1.59   A1C: Lab Results  Component Value Date   HGBA1C 6.2 (A) 02/12/2024   HGBA1C 6.2 02/12/2024   HGBA1C 6.2 02/12/2024   HGBA1C 6.2 02/12/2024    Assessment & Plan Primary hypertension Bp better today  Cont with hydrochlorothiazide , lisinopril   Will check bmp Will check if her pharmacy can do blister package    Acute cough Resolved Lungs clear    Gastroesophageal reflux disease, unspecified whether esophagitis present Avoid spicy foods Take Protonix  prn    Postmenopausal estrogen deficiency  Orders:   DG Bone Density; Future  B12 deficiency  Orders:   cyanocobalamin  (VITAMIN B12) 500 MCG tablet; Take 1 tablet (500 mcg total) by mouth daily.  Type 2 diabetes mellitus with stage 3a chronic kidney disease, without long-term current use of insulin  (HCC) Lab Results  Component Value Date   HGBA1C 6.2 (A) 02/12/2024   HGBA1C 6.2 02/12/2024   HGBA1C 6.2 02/12/2024   HGBA1C 6.2 02/12/2024   At goal  Foot exam done today  Orders:   Ambulatory referral to Ophthalmology  CKD stage 3a, GFR 45-59 ml/min (HCC) Avoid nephrotoxic meds Will check bmp Orders:   Basic Metabolic Panel (BMET)   Return in about 3 months (around 06/09/2024).BETHA Jackalyn Blazing

## 2024-03-12 ENCOUNTER — Ambulatory Visit: Payer: Self-pay | Admitting: Sports Medicine

## 2024-04-28 ENCOUNTER — Other Ambulatory Visit (INDEPENDENT_AMBULATORY_CARE_PROVIDER_SITE_OTHER): Payer: Self-pay

## 2024-05-19 ENCOUNTER — Encounter (INDEPENDENT_AMBULATORY_CARE_PROVIDER_SITE_OTHER): Payer: Self-pay | Admitting: Ophthalmology

## 2024-05-19 DIAGNOSIS — H3581 Retinal edema: Secondary | ICD-10-CM

## 2024-06-09 ENCOUNTER — Ambulatory Visit: Admitting: Sports Medicine
